# Patient Record
Sex: Male | Born: 1960
Health system: Southern US, Community
[De-identification: ages and names within clinical notes are randomized; demographics above are authoritative.]

## PROBLEM LIST (undated history)

## (undated) DIAGNOSIS — I10 Essential (primary) hypertension: Secondary | ICD-10-CM

## (undated) DIAGNOSIS — K269 Duodenal ulcer, unspecified as acute or chronic, without hemorrhage or perforation: Secondary | ICD-10-CM

## (undated) DIAGNOSIS — Z9889 Other specified postprocedural states: Secondary | ICD-10-CM

## (undated) DIAGNOSIS — L570 Actinic keratosis: Secondary | ICD-10-CM

## (undated) DIAGNOSIS — K635 Polyp of colon: Secondary | ICD-10-CM

## (undated) DIAGNOSIS — R011 Cardiac murmur, unspecified: Secondary | ICD-10-CM

## (undated) DIAGNOSIS — E7800 Pure hypercholesterolemia, unspecified: Secondary | ICD-10-CM

## (undated) DIAGNOSIS — I519 Heart disease, unspecified: Secondary | ICD-10-CM

## (undated) HISTORY — DX: Actinic keratosis: L57.0

## (undated) HISTORY — PX: APPENDECTOMY: SHX54

## (undated) HISTORY — DX: Cardiac murmur, unspecified: R01.1

## (undated) HISTORY — DX: Essential (primary) hypertension: I10

## (undated) HISTORY — PX: CARDIAC CATHETERIZATION: SHX172

## (undated) HISTORY — PX: SHOULDER ARTHROSCOPY W/ ROTATOR CUFF REPAIR: SHX2400

## (undated) SURGERY — LEFT HEART CATH AND CORONARY ANGIOGRAPHY
Anesthesia: Moderate Sedation

---

## 2017-06-20 DIAGNOSIS — Z9889 Other specified postprocedural states: Secondary | ICD-10-CM

## 2017-06-20 HISTORY — DX: Other specified postprocedural states: Z98.890

## 2017-06-30 ENCOUNTER — Encounter: Payer: Self-pay | Admitting: Family Medicine

## 2017-06-30 ENCOUNTER — Ambulatory Visit: Payer: BLUE CROSS/BLUE SHIELD | Admitting: Family Medicine

## 2017-06-30 VITALS — BP 120/60 | HR 84 | Temp 98.0°F | Resp 16 | Ht 69.0 in | Wt 201.0 lb

## 2017-06-30 DIAGNOSIS — I10 Essential (primary) hypertension: Secondary | ICD-10-CM | POA: Insufficient documentation

## 2017-06-30 DIAGNOSIS — M7071 Other bursitis of hip, right hip: Secondary | ICD-10-CM | POA: Diagnosis not present

## 2017-06-30 DIAGNOSIS — E785 Hyperlipidemia, unspecified: Secondary | ICD-10-CM | POA: Diagnosis not present

## 2017-06-30 DIAGNOSIS — K219 Gastro-esophageal reflux disease without esophagitis: Secondary | ICD-10-CM | POA: Insufficient documentation

## 2017-06-30 DIAGNOSIS — R011 Cardiac murmur, unspecified: Secondary | ICD-10-CM | POA: Insufficient documentation

## 2017-06-30 MED ORDER — ATORVASTATIN CALCIUM 10 MG PO TABS: 10.0000 mg | ORAL_TABLET | Freq: Every day | ORAL | 1 refills | Status: DC

## 2017-06-30 MED ORDER — AMLODIPINE BESY-BENAZEPRIL HCL 5-20 MG PO CAPS: 1.0000 | ORAL_CAPSULE | Freq: Every day | ORAL | 1 refills | Status: DC

## 2017-06-30 MED ORDER — NAPROXEN 500 MG PO TABS: 500.0000 mg | ORAL_TABLET | Freq: Two times a day (BID) | ORAL | 0 refills | Status: DC

## 2017-06-30 NOTE — Progress Notes (Signed)
Patient: Kevin Crawford Male    DOB: [birth year]   57 y.o.   MRN: [id number] Visit Date: 06/30/2017  Today's Provider: Lelon Huh, MD   Chief Complaint  Patient presents with  . Establish Care  . Back Pain  . Leg Pain   Subjective:    Patient presents to establish care. He recently moved from [place] after retiring from [employer] where he worked for several decades. He has history of hypertenstion and hyperlipidemia which have been well controlled. He brings in copy of most recently labs done over the summer which were normal.   Patient has had right hip and back pain for over a week . Patient states pain started in low right side of back and moved into right hip. Patient also reports some pain in right buttock. Patient states there was no injury mechanism, his back and hip began hurting out of the blue. Patient has had this same pain occur about 6 months ago. Patient has information concerning first episode with him. Patient has been using ice, heat and occasional Aleve for the pain with moderate relief.    Hip Pain   The incident occurred more than 1 week ago. The incident occurred at home. There was no injury mechanism. The pain is present in the right hip. The quality of the pain is described as shooting and cramping. The pain is at a severity of 4/10. The pain is moderate. The pain has been improving since onset. Associated symptoms include an inability to bear weight. He reports no foreign bodies present. The symptoms are aggravated by movement and weight bearing. He has tried ice, heat and rest for the symptoms. The treatment provided moderate relief.   States had similar pain about 6 months ago. He was referred to physical therapy and diagnosed with pyriformis syndrome and symptoms had completely resolved. Marland Kitchen Has been taking OTC Aleve and states pain has mostly resolved the last few days. He is going to [place] next week and is concerned it may act up while  travelling as he is expecting to do a lot of travelling.    Allergies  Allergen Reactions  . Phenergan [Promethazine Hcl] Other (See Comments)    SEIZURES  . Mobic [Meloxicam] Other (See Comments)    Severe cramping     Current Outpatient Medications:  .  amLODipine-benazepril (LOTREL) 5-20 MG capsule, Take 1 capsule by mouth daily., Disp: , Rfl:  .  atorvastatin (LIPITOR) 10 MG tablet, Take 10 mg by mouth daily., Disp: , Rfl:  .  Multiple Vitamin (MULTIVITAMIN) tablet, Take 1 tablet by mouth daily., Disp: , Rfl:  .  Omega-3 Fatty Acids (FISH OIL) 1000 MG CAPS, Take 1 capsule by mouth daily., Disp: , Rfl:  .  tadalafil (CIALIS) 5 MG tablet, Take 5 mg by mouth. Take 1 tablet By Mouth Twice A Week, Disp: , Rfl: 0  Review of Systems  Musculoskeletal: Positive for arthralgias, back pain and myalgias.  All other systems reviewed and are negative.   Social History   Tobacco Use  . Smoking status: Former Smoker    Last attempt to quit: 06/21/2003    Years since quitting: 14.0  . Smokeless tobacco: Never Used  Substance Use Topics  . Alcohol use: Yes    Comment: 5-7 drinks each week; varies   Objective:   BP 120/60 (BP Location: Right Arm, Patient Position: Sitting, Cuff Size: Large)   Pulse 84   Temp 98 F (36.7 C) (Oral)  Resp 16   Ht 5\' 9"  (1.753 m)   Wt 201 lb (91.2 kg)   SpO2 98%   BMI 29.68 kg/m  Vitals:   06/30/17 1547  BP: 120/60  Pulse: 84  Resp: 16  Temp: 98 F (36.7 C)  TempSrc: Oral  SpO2: 98%  Weight: 201 lb (91.2 kg)  Height: 5\' 9"  (1.753 m)     Physical Exam  General appearance: alert, well developed, well nourished, cooperative and in no distress Head: Normocephalic, without obvious abnormality, atraumatic Respiratory: Respirations even and unlabored, normal respiratory rate Extremities: Mild tenderness lumbar spine, right buttocks and left lateral leg.     Assessment & Plan:     1. Bursitis of other bursa of right hip Mostly resolved at  this time. Offered steroid injection which he declined. Sent prescription for naproxen to take with food if symptoms flare up. Continue scheduled treatments with heat and ice. C  2. Essential hypertension Well controlled.  Continue current medications.   - amLODipine-benazepril (LOTREL) 5-20 MG capsule; Take 1 capsule by mouth daily.  Dispense: 90 capsule; Refill: 1  3. Hyperlipidemia, unspecified hyperlipidemia type  - atorvastatin (LIPITOR) 10 MG tablet; Take 1 tablet (10 mg total) by mouth daily.  Dispense: 90 tablet; Refill: 1       Lelon Huh, MD  [facility]

## 2017-07-02 ENCOUNTER — Encounter: Payer: Self-pay | Admitting: Family Medicine

## 2017-08-25 ENCOUNTER — Encounter: Payer: Self-pay | Admitting: Family Medicine

## 2017-08-25 ENCOUNTER — Ambulatory Visit: Payer: BLUE CROSS/BLUE SHIELD | Admitting: Family Medicine

## 2017-08-25 VITALS — BP 132/80 | HR 82 | Temp 98.0°F | Resp 16

## 2017-08-25 DIAGNOSIS — R079 Chest pain, unspecified: Secondary | ICD-10-CM | POA: Diagnosis not present

## 2017-08-25 DIAGNOSIS — R011 Cardiac murmur, unspecified: Secondary | ICD-10-CM

## 2017-08-25 DIAGNOSIS — R0989 Other specified symptoms and signs involving the circulatory and respiratory systems: Secondary | ICD-10-CM | POA: Diagnosis not present

## 2017-08-25 DIAGNOSIS — M7071 Other bursitis of hip, right hip: Secondary | ICD-10-CM | POA: Diagnosis not present

## 2017-08-25 NOTE — Progress Notes (Signed)
Patient: Kevin Crawford Male    DOB: 1960-08-18   57 y.o.   MRN: [id number] Visit Date: 08/25/2017  Today's Provider: Lelon Huh, MD   Chief Complaint  Patient presents with  . Back Pain   Subjective:    Back Pain  This is a new problem. The current episode started more than 1 month ago. The problem occurs constantly. The problem has been gradually worsening since onset.   Patient reports that this pain is similar to the pain he had when he was seen in the office on 06/30/2017. He reports that due to a new position at his job, he has to do more physical labor that requires heavy lifting. He reports that the pain is in his lower back, and it is becoming more constant. He reports that the medication that was started (naproxen) helped a little, but it has not resolved the pain. He reports that on last visit physical therapy was recommended which he would like to pursue.   Pain had resolved until he recently started working at [employer] at [employer]. Pain is in left lower back and into right lateral leg. Back brace has helped.   Chest pain He reports a long history of chest pains with exertion which have more frequent and intense the last couple of months. He states he has a couple of stress tests in the past when he lived in [place] which were normal, but it has been several years and pains has gotten significantly worse. He requests coronary CT angiogram be ordered.  He had [facility] screening done lately which revealed moderate left carotid artery disease which he is concerned about. He denies any new neurological symptoms.      Allergies  Allergen Reactions  . Phenergan [Promethazine Hcl] Other (See Comments)    SEIZURES  . Mobic [Meloxicam] Other (See Comments)    Severe cramping     Current Outpatient Medications:  .  amLODipine-benazepril (LOTREL) 5-20 MG capsule, Take 1 capsule by mouth daily., Disp: 90 capsule, Rfl: 1 .  atorvastatin (LIPITOR) 10 MG tablet, Take 1  tablet (10 mg total) by mouth daily., Disp: 90 tablet, Rfl: 1 .  Multiple Vitamin (MULTIVITAMIN) tablet, Take 1 tablet by mouth daily., Disp: , Rfl:  .  naproxen (NAPROSYN) 500 MG tablet, Take 1 tablet (500 mg total) by mouth 2 (two) times daily with a meal., Disp: 30 tablet, Rfl: 0 .  Omega-3 Fatty Acids (FISH OIL) 1000 MG CAPS, Take 1 capsule by mouth daily., Disp: , Rfl:  .  tadalafil (CIALIS) 5 MG tablet, Take 5 mg by mouth. Take 1 tablet By Mouth Twice A Week, Disp: , Rfl: 0  Review of Systems  Constitutional: Negative.   Cardiovascular: Negative for leg swelling.  Musculoskeletal: Positive for back pain and myalgias.  Neurological: Negative.     Social History   Tobacco Use  . Smoking status: Former Smoker    Last attempt to quit: 06/21/2003    Years since quitting: 14.1  . Smokeless tobacco: Never Used  Substance Use Topics  . Alcohol use: Yes    Comment: 5-7 drinks each week; varies   Objective:   BP 132/80 (BP Location: Right Arm, Patient Position: Sitting, Cuff Size: Large)   Pulse 82   Temp 98 F (36.7 C)   Resp 16   SpO2 98%  Vitals:   08/25/17 1538  BP: 132/80  Pulse: 82  Resp: 16  Temp: 98 F (36.7 C)  SpO2:  98%     Physical Exam   General Appearance:    Alert, cooperative, no distress  Eyes:    PERRL, conjunctiva/corneas clear, EOM's intact       Lungs:     Clear to auscultation bilaterally, respirations unlabored  Heart:    Regular rate and rhythm, faint left carotic bruit. II/VI systolic murmur RUSB  Neurologic:   Awake, alert, oriented x 3. No apparent focal neurological           defect.           Assessment & Plan:     1. Bursitis of other bursa of right hip Recurrent with minimal relief from NSAIDs.  - Ambulatory referral to Physical Therapy  2. Chest pain, unspecified type He reports having negative stress tests in the past, but he feels that he needs a more detailed imaging and specifically requests a coronary CT angiogram. It has been  several years since last test, his chest pains have worsened, and e does have several cardiac risk factors. Counseled that he needs to discuss options of cardiac studies with an appropriately trained cardiologist.  - Ambulatory referral to Cardiology  3. Heart murmur Long standing by patient report.   4. Bruit of left carotid artery  - US Carotid Duplex Bilateral; Future  Continue statin, ECASA and current blood pressure medications.       Lelon Huh, MD  [facility]

## 2017-08-25 NOTE — Patient Instructions (Signed)
   Reduce alcohol to either one glass of liquor or 2 glasses of wine per day

## 2017-08-30 ENCOUNTER — Other Ambulatory Visit: Payer: Self-pay

## 2017-08-30 ENCOUNTER — Encounter: Payer: Self-pay | Admitting: Physical Therapy

## 2017-08-30 ENCOUNTER — Ambulatory Visit: Payer: BLUE CROSS/BLUE SHIELD | Attending: Family Medicine | Admitting: Physical Therapy

## 2017-08-30 ENCOUNTER — Ambulatory Visit: Payer: BLUE CROSS/BLUE SHIELD | Admitting: Physical Therapy

## 2017-08-30 DIAGNOSIS — M6281 Muscle weakness (generalized): Secondary | ICD-10-CM

## 2017-08-30 DIAGNOSIS — M25551 Pain in right hip: Secondary | ICD-10-CM | POA: Insufficient documentation

## 2017-08-30 DIAGNOSIS — M545 Low back pain: Secondary | ICD-10-CM | POA: Insufficient documentation

## 2017-08-30 DIAGNOSIS — M62830 Muscle spasm of back: Secondary | ICD-10-CM | POA: Diagnosis present

## 2017-08-30 DIAGNOSIS — R262 Difficulty in walking, not elsewhere classified: Secondary | ICD-10-CM | POA: Diagnosis present

## 2017-08-31 ENCOUNTER — Ambulatory Visit
Admission: RE | Admit: 2017-08-31 | Discharge: 2017-08-31 | Disposition: A | Discharge status: Home / Self Care | Payer: BLUE CROSS/BLUE SHIELD | Source: Ambulatory Visit | Attending: Family Medicine | Admitting: Family Medicine

## 2017-08-31 ENCOUNTER — Telehealth: Payer: Self-pay

## 2017-08-31 ENCOUNTER — Other Ambulatory Visit: Payer: Self-pay

## 2017-08-31 DIAGNOSIS — R9389 Abnormal findings on diagnostic imaging of other specified body structures: Secondary | ICD-10-CM | POA: Insufficient documentation

## 2017-08-31 DIAGNOSIS — I6523 Occlusion and stenosis of bilateral carotid arteries: Secondary | ICD-10-CM | POA: Diagnosis not present

## 2017-08-31 DIAGNOSIS — R0989 Other specified symptoms and signs involving the circulatory and respiratory systems: Secondary | ICD-10-CM

## 2017-08-31 MED ORDER — IOPAMIDOL (ISOVUE-370) INJECTION 76%
75.0000 mL | Freq: Once | INTRAVENOUS | Status: AC | PRN
  Administered 2017-08-31: 75 mL via INTRAVENOUS

## 2017-08-31 NOTE — Telephone Encounter (Signed)
Call report from Ultrasound:    IMPRESSION: Less than 50% stenosis in the right and left internal carotid arteries.  Linear hyperechoic structure within the left bulb is noted. This may simply represent chronic atherosclerotic plaque. A non flow limiting dissection cannot be excluded. Consider CT angiogram of the neck to further characterize.  Dr. Rosanna Randy advised. Order for CT placed.

## 2017-08-31 NOTE — Therapy (Signed)
[facility] [address], [place], [address] Phone: [phone]   Fax:  [phone]  Physical Therapy Evaluation  Patient Details  Name: Kevin Crawford MRN: [id number] Date of Birth: 1960-09-18 Referring Provider: Lelon Huh MD   Encounter Date: 08/30/2017  PT End of Session - 08/30/17 1924    Visit Number  1    Number of Visits  12    Date for PT Re-Evaluation  10/11/17    PT Start Time  2542    PT Stop Time  1614    PT Time Calculation (min)  58 min    Activity Tolerance  Patient tolerated treatment well    Behavior During Therapy  [facility] for tasks assessed/performed       Past Medical History:  Diagnosis Date  . GERD (gastroesophageal reflux disease)   . Heart murmur   . Hyperlipidemia   . Hypertension     Past Surgical History:  Procedure Laterality Date  . SHOULDER ARTHROSCOPY W/ ROTATOR CUFF REPAIR Right     There were no vitals filed for this visit.   Subjective Assessment - 08/30/17 1532    Subjective  reports right hip bone lateral aspect very painful with standing and walking. heat relieves symptoms.     Pertinent History  previous back pain about a year ago left side, went to therapy with good results with STM with ball and exercise at home. this current episode began january 2019 for no apparent reason: retired 03/2017 and became sedentary and noticed back pain which resolved within 2 weeks. 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    Limitations  Lifting;Standing;House hold activities;Walking;Other (comment) working     How long can you sit comfortably?  short periods    How long can you stand comfortably?  unable    How long can you walk comfortably?  unable    Patient Stated Goals  to relieve the back and right hip pain to be able to walk  with less difficulty and pain    Currently in Pain?  Yes    Pain Score  4  worst 10/10; best 0/10    Pain Location  Hip    Pain Orientation  Right    Pain Descriptors / Indicators  Aching    Pain Type  Acute pain    Pain Onset  1 to 4 weeks ago    Pain Frequency  Intermittent    Effect of Pain on Daily Activities  limits all ADL's and work related duties         [facility] PT Assessment - 08/30/17 1525      Assessment   Medical Diagnosis  right hip bursitis, back pain with sciatica    Referring Provider  Lelon Huh MD    Onset Date/Surgical Date  08/18/17 back pain january, resolved and then hip began 2 weeks ago    Hand Dominance  Right    Prior Therapy  yes  01/2017 for back pain with with good results      Precautions   Precautions  None      Balance Screen   Has the patient fallen in the past 6 months  No      [place] residence    Living Arrangements  Spouse/significant  other;Children    Type of Home  House    Home Access  Stairs to enter    Entrance Stairs-Number of Steps  3    Entrance Stairs-Rails  None    Home Layout  One level      Prior Function   Level of Independence  Independent    Vocation  Part time employment;Retired    Biomedical scientist  walking, standing, [employer] treasurer, tiled floor      Cognition   Overall Cognitive Status  Within Functional Limits for tasks assessed      Observation/Other Assessments   Focus on Therapeutic Outcomes (FOTO)   to be assessed     Lower Extremity Functional Scale   to be assessed      ROM / Strength   AROM / PROM / Strength  AROM;Strength      AROM   Overall AROM Comments  lumbar spine flexion, extension limited 50% without reprocuction of symptoms; LE's decreased right hip ER with pain        Strength   Overall Strength Comments  grossly bilateral LE's major muscle groups WFL without reproduction of symtpoms      Palpation   Palpation comment   spasms  along lumbar spine right side       FABER test   findings  Positive    Side  Right    Comment  mid symptom reproduction      Slump test   Findings  Negative    Comment  bilaterally      Straight Leg Raise   Findings  Negative    Comment  each LE and crossed SLR negative       SI Compression   Findings  Negative      SI Distraction   Findings   Negative      Ambulation/Gait   Gait Comments  antalgic             Objective measurements completed on examination: See above findings.      PT Education - 08/30/17 1800    Education provided  Yes    Education Details  POC for therapy; exercise instruction for home: body mechanics, hip adduction with ball and glute sets, hip abduction with resistive band    Person(s) Educated  Patient    Methods  Explanation;Demonstration;Verbal cues;Handout    Comprehension  Verbalized understanding;Returned demonstration          PT Long Term Goals - 08/30/17 2037      PT LONG TERM GOAL #1   Title  Patient will report max pain level 5/10 in right hip and lower back with standing and walking to demonstrate improved function with daily tasks at home/work    Baseline  pain worst 10/10    Status  New    Target Date  09/20/17      PT LONG TERM GOAL #2   Title  Patient will report max pain level 3/10 in right hip and lower back with standing and walking to demonstrate improved function with daily tasks at home/work    Baseline  pain worst 10/10    Status  New    Target Date  10/11/17      PT LONG TERM GOAL #3   Title  patient will improve LEFS score to 60/80 demonstrating improved function with daily tasks with less difficulty    Baseline  LEFS    Status  New    Target Date  10/11/17  PT LONG TERM GOAL #4   Title  patient will be independent with pain control, exercises for self managment of symptoms at discharge from physical therapy    Baseline  limited to no knowledge of appropriate pain control stratgies, exercises  progression without guidance, cuing    Status  New    Target Date  10/11/17             Plan - 08/30/17 1800    Clinical Impression Statement  patient is a 57 year old male who presents with back and right hip pain that has worsened in symptoms since January 2019. He has difficulty with standing, walking which limits function at home and work. He has limited to no knowledge of appropriate pain control strategies or exercise progression and will benefit from physical therapy intervention to improve function.     History and Personal Factors relevant to plan of care:  previous back pain about a year ago left side, went to therapy with good results with STM with ball and exercise at home. this current episode began january 2019 for no apparent reason: retired 03/2017 and became sedentary and noticed back pain which resolved within 2 weeks. 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    Clinical Presentation  Evolving    Clinical Presentation due to:  worsening symptoms into right LE/hip    Clinical Decision Making  Low    Rehab Potential  Good    Clinical Impairments Affecting Rehab Potential  (+)age, acute condition, motivated(-)    PT Frequency  2x / week    PT Duration  6 weeks    PT Treatment/Interventions  Electrical Stimulation;Cryotherapy;Iontophoresis 4mg /ml Dexamethasone;Moist Heat;Ultrasound;Therapeutic exercise;Therapeutic activities;Neuromuscular re-education;Patient/family education;Manual techniques;Dry needling    PT Next Visit Plan  pain control, progressive exercise, manual techniques    PT Home Exercise Plan  posture awareness, body mechanics for daily tasks, exercise sitting hip adduction, abduction isometrics    Consulted and Agree with Plan of Care  Patient       Patient will benefit from skilled therapeutic intervention in order to  improve the following deficits and impairments:  Abnormal gait, Pain, Improper body mechanics, Increased muscle spasms, Decreased activity tolerance, Decreased endurance, Decreased range of motion, Decreased strength, Impaired perceived functional ability  Visit Diagnosis: Pain in right hip - Plan: PT plan of care cert/re-cert  Bilateral low back pain, unspecified chronicity, with sciatica presence unspecified - Plan: PT plan of care cert/re-cert  Muscle weakness (generalized) - Plan: PT plan of care cert/re-cert  Difficulty in walking, not elsewhere classified - Plan: PT plan of care cert/re-cert     Problem List Patient Active Problem List   Diagnosis Date Noted  . GERD (gastroesophageal reflux disease) 06/30/2017  . Heart murmur 06/30/2017  . Hyperlipidemia 06/30/2017  . Hypertension 06/30/2017    Jomarie Longs PT 08/31/2017, 8:51 PM  [facility] [address] [place], [address] Phone: [phone]   Fax:  [phone]  Name: Kevin Crawford MRN: [id number] Date of Birth: 06-20-1961

## 2017-09-06 ENCOUNTER — Encounter: Payer: Self-pay | Admitting: Physical Therapy

## 2017-09-06 ENCOUNTER — Ambulatory Visit: Payer: BLUE CROSS/BLUE SHIELD | Admitting: Physical Therapy

## 2017-09-06 DIAGNOSIS — M25551 Pain in right hip: Secondary | ICD-10-CM

## 2017-09-06 DIAGNOSIS — M545 Low back pain: Secondary | ICD-10-CM

## 2017-09-06 DIAGNOSIS — M62830 Muscle spasm of back: Secondary | ICD-10-CM

## 2017-09-06 DIAGNOSIS — M6281 Muscle weakness (generalized): Secondary | ICD-10-CM

## 2017-09-06 NOTE — Therapy (Signed)
[facility] [address] [place], [address] Phone: [phone]   Fax:  [phone]  Physical Therapy Treatment  Patient Details  Name: Kevin Crawford MRN: [id number] Date of Birth: 1960/07/11 Referring Provider: Lelon Huh MD   Encounter Date: 09/06/2017  PT End of Session - 09/06/17 1107    Visit Number  2    Number of Visits  12    Date for PT Re-Evaluation  10/11/17    PT Start Time  1020    PT Stop Time  1106    PT Time Calculation (min)  46 min    Activity Tolerance  Patient tolerated treatment well    Behavior During Therapy  [facility] for tasks assessed/performed       Past Medical History:  Diagnosis Date  . GERD (gastroesophageal reflux disease)   . Heart murmur   . Hyperlipidemia   . Hypertension     Past Surgical History:  Procedure Laterality Date  . SHOULDER ARTHROSCOPY W/ ROTATOR CUFF REPAIR Right     There were no vitals filed for this visit.  Subjective Assessment - 09/06/17 1032    Subjective  Patient reports he is having minor pains intermittently in lower back at this time. He is exercising as instructed and added a few more stretches and leg lifts prone.     Pertinent History  previous back pain about a year ago left side, went to therapy with good results with STM with ball and exercise at home. this current episode began january 2019 for no apparent reason: retired 03/2017 and became sedentary and noticed back pain which resolved within 2 weeks. 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    Limitations  Lifting;Standing;House hold activities;Walking;Other (comment) working     How long can you sit comfortably?  short periods    How long can you stand comfortably?  unable    How long can you walk comfortably?  unable    Patient Stated  Goals  to relieve the back and right hip pain to be able to walk with less difficulty and pain    Currently in Pain?  No/denies       Objective:  Gait: WNL with equal weight distribution, trunk rotation  AROM: lumbar spine flexion 25% limited, extension < 20% limited and both improved from previous session   Treatment: Manual therapy: 13 min. goal: improve soft tissue elasticity, decrease spasm patient prone lying over one pillow: STM performed with superficial techniques to lower back, lumbar paraspinal and QL muscles right > left side  Therapeutic exercise: Patient performed with demonstration, verbal cues of therapist: goal: independent with home program, improved function with daily tasks, LEFS Prone lying:  Hip extension 2 x 5 reps each LE Upper back extension with arms at sides x 5 reps  Standing:  Diagonal wedding march holding (2) 5# weights x 2 min. Side stepping with green resistive band around thighs x 1.5 min with patient reporting increased soreness bilateral hips Re assessed HEP verbally and discussed new exercises patient is performing at home including proper stretching for back  Patient response to treatment: patient demonstrated improved technique with exercises with minimal VC for correct alignment. Patient with decreased spasms by 50% following STM. Improved motor control with repetition and cuing     PT Education -  09/06/17 1338    Education provided  Yes    Education Details  exercise instruction for HEP; re assessed exercises given    Person(s) Educated  Patient    Methods  Explanation;Demonstration;Verbal cues;Handout    Comprehension  Verbalized understanding;Returned demonstration;Verbal cues required          PT Long Term Goals - 08/30/17 2037      PT LONG TERM GOAL #1   Title  Patient will report max pain level 5/10 in right hip and lower back with standing and walking to demonstrate improved function with daily tasks at home/work    Baseline   pain worst 10/10    Status  New    Target Date  09/20/17      PT LONG TERM GOAL #2   Title  Patient will report max pain level 3/10 in right hip and lower back with standing and walking to demonstrate improved function with daily tasks at home/work    Baseline  pain worst 10/10    Status  New    Target Date  10/11/17      PT LONG TERM GOAL #3   Title  patient will improve LEFS score to 60/80 demonstrating improved function with daily tasks with less difficulty    Baseline  LEFS    Status  New    Target Date  10/11/17      PT LONG TERM GOAL #4   Title  patient will be independent with pain control, exercises for self managment of symptoms at discharge from physical therapy    Baseline  limited to no knowledge of appropriate pain control stratgies, exercises progression without guidance, cuing    Status  New    Target Date  10/11/17            Plan - 09/06/17 1338    Clinical Impression Statement  Patient is progressing well towards goals with decreasing pain and improving strength and knowledge of exercise progression. He demonstrated decreased spasms in right lower back and improved technqiue with exercise with demonstration, verbal cues and repetition.     Rehab Potential  Good    Clinical Impairments Affecting Rehab Potential  (+)age, acute condition, motivated(-)    PT Frequency  2x / week    PT Duration  6 weeks    PT Treatment/Interventions  Electrical Stimulation;Cryotherapy;Iontophoresis 4mg /ml Dexamethasone;Moist Heat;Ultrasound;Therapeutic exercise;Therapeutic activities;Neuromuscular re-education;Patient/family education;Manual techniques;Dry needling    PT Next Visit Plan  pain control, progressive exercise, manual techniques    PT Home Exercise Plan  posture awareness, body mechanics for daily tasks, exercise sitting hip adduction, abduction isometrics       Patient will benefit from skilled therapeutic intervention in order to improve the following deficits and  impairments:  Abnormal gait, Pain, Improper body mechanics, Increased muscle spasms, Decreased activity tolerance, Decreased endurance, Decreased range of motion, Decreased strength, Impaired perceived functional ability  Visit Diagnosis: Pain in right hip  Bilateral low back pain, unspecified chronicity, with sciatica presence unspecified  Muscle weakness (generalized)  Muscle spasm of back     Problem List Patient Active Problem List   Diagnosis Date Noted  . GERD (gastroesophageal reflux disease) 06/30/2017  . Heart murmur 06/30/2017  . Hyperlipidemia 06/30/2017  . Hypertension 06/30/2017    Jomarie Longs PT 09/06/2017, 1:41 PM  [place] [facility] [address] [place], [address] Phone: [phone]   Fax:  [phone]  Name: Kevin Crawford MRN: [id number] Date of Birth:  01/24/1961   

## 2017-09-07 ENCOUNTER — Ambulatory Visit: Payer: BLUE CROSS/BLUE SHIELD | Admitting: Physical Therapy

## 2017-09-11 ENCOUNTER — Ambulatory Visit: Payer: BLUE CROSS/BLUE SHIELD | Admitting: Physical Therapy

## 2017-09-13 ENCOUNTER — Ambulatory Visit: Payer: BLUE CROSS/BLUE SHIELD | Admitting: Physical Therapy

## 2017-09-13 ENCOUNTER — Encounter: Payer: Self-pay | Admitting: Physical Therapy

## 2017-09-13 DIAGNOSIS — M25551 Pain in right hip: Secondary | ICD-10-CM

## 2017-09-13 DIAGNOSIS — M6281 Muscle weakness (generalized): Secondary | ICD-10-CM

## 2017-09-13 DIAGNOSIS — M62830 Muscle spasm of back: Secondary | ICD-10-CM

## 2017-09-13 DIAGNOSIS — M545 Low back pain: Secondary | ICD-10-CM

## 2017-09-14 NOTE — Therapy (Signed)
[facility] [address] [place], [address] Phone: [phone]   Fax:  [phone]  Physical Therapy Treatment  Patient Details  Name: Malin Cervini MRN: [id number] Date of Birth: 26-Oct-1960 Referring Provider: Lelon Huh MD   Encounter Date: 09/13/2017  PT End of Session - 09/13/17 1434    Visit Number  3    Number of Visits  12    Date for PT Re-Evaluation  10/11/17    PT Start Time  9381    PT Stop Time  1511    PT Time Calculation (min)  39 min    Activity Tolerance  Patient tolerated treatment well    Behavior During Therapy  [facility] for tasks assessed/performed       Past Medical History:  Diagnosis Date  . GERD (gastroesophageal reflux disease)   . Heart murmur   . Hyperlipidemia   . Hypertension     Past Surgical History:  Procedure Laterality Date  . SHOULDER ARTHROSCOPY W/ ROTATOR CUFF REPAIR Right     There were no vitals filed for this visit.  Subjective Assessment - 09/13/17 1433    Subjective  Patient reports he is doing well and is not having pain as long as he consistently exercises in the morning.     Pertinent History  previous back pain about a year ago left side, went to therapy with good results with STM with ball and exercise at home. this current episode began january 2019 for no apparent reason: retired 03/2017 and became sedentary and noticed back pain which resolved within 2 weeks. 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    Limitations  Lifting;Standing;House hold activities;Walking;Other (comment) working     How long can you sit comfortably?  short periods    How long can you stand comfortably?  unable    How long can you walk comfortably?  unable    Patient Stated Goals  to relieve the back and right hip pain to be able to  walk with less difficulty and pain    Currently in Pain?  No/denies          Objective:  Gait: WNL with equal weight distribution, trunk rotation  Palpation: lumbar spine mild increased tone muscles, significantly decreased from previous session  Treatment:  Therapeutic exercise: Patient performed with demonstration, verbal cues of therapist: goal: independent with home program, improved function with daily tasks, LEFS  Prone lying:  Hip extension 2 x 5 reps each LE  side lying clamshells x 15 reps each LE   Standing:  resistive band walking forward x 10, backwards x 10, side step x 5 each right/left hip rotary machine for hip extension 100# x 15 each LE; hip abduction 55# x 15 reps each LE blue resistive band around thighs standing hip abduction and extension alternating each exercise/LE 3 x 5 reps    Patient response to treatment: improved technique with exercises with minimal verbal cuing and demonstraion and repetition.    PT Education - 09/13/17 1434    Education provided  Yes    Education Details  exercise instruction for HEP re assessed    Person(s) Educated  Patient    Methods  Explanation;Demonstration;Verbal cues    Comprehension  Verbalized understanding;Returned demonstration;Verbal cues required  PT Long Term Goals - 08/30/17 2037      PT LONG TERM GOAL #1   Title  Patient will report max pain level 5/10 in right hip and lower back with standing and walking to demonstrate improved function with daily tasks at home/work    Baseline  pain worst 10/10    Status  New    Target Date  09/20/17      PT LONG TERM GOAL #2   Title  Patient will report max pain level 3/10 in right hip and lower back with standing and walking to demonstrate improved function with daily tasks at home/work    Baseline  pain worst 10/10    Status  New    Target Date  10/11/17      PT LONG TERM GOAL #3   Title  patient will improve LEFS score to 60/80 demonstrating improved  function with daily tasks with less difficulty    Baseline  LEFS    Status  New    Target Date  10/11/17      PT LONG TERM GOAL #4   Title  patient will be independent with pain control, exercises for self managment of symptoms at discharge from physical therapy    Baseline  limited to no knowledge of appropriate pain control stratgies, exercises progression without guidance, cuing    Status  New    Target Date  10/11/17            Plan - 09/13/17 1541    Clinical Impression Statement  Patient continues to progress towards goals wtih decreasing pain and progressing exercises for strength and flexibility with minimal cuing. He continues with lower back pain that is intermittent and he is improving function with daily tasks.     Rehab Potential  Good    Clinical Impairments Affecting Rehab Potential  (+)age, acute condition, motivated(-)    PT Frequency  2x / week    PT Duration  6 weeks    PT Treatment/Interventions  Electrical Stimulation;Cryotherapy;Iontophoresis 4mg /ml Dexamethasone;Moist Heat;Ultrasound;Therapeutic exercise;Therapeutic activities;Neuromuscular re-education;Patient/family education;Manual techniques;Dry needling    PT Next Visit Plan  pain control, progressive exercise, manual techniques    PT Home Exercise Plan  posture awareness, body mechanics for daily tasks, exercise sitting hip adduction, abduction isometrics       Patient will benefit from skilled therapeutic intervention in order to improve the following deficits and impairments:  Abnormal gait, Pain, Improper body mechanics, Increased muscle spasms, Decreased activity tolerance, Decreased endurance, Decreased range of motion, Decreased strength, Impaired perceived functional ability  Visit Diagnosis: Pain in right hip  Bilateral low back pain, unspecified chronicity, with sciatica presence unspecified  Muscle weakness (generalized)  Muscle spasm of back     Problem List Patient Active Problem  List   Diagnosis Date Noted  . GERD (gastroesophageal reflux disease) 06/30/2017  . Heart murmur 06/30/2017  . Hyperlipidemia 06/30/2017  . Hypertension 06/30/2017    Jomarie Longs PT 09/14/2017, 9:49 PM  [place] [address] [place], [address] Phone: [phone]   Fax:  [phone]  Name: Krishawn Vanderweele MRN: [id number] Date of Birth: Nov 14, 1960

## 2017-09-20 ENCOUNTER — Ambulatory Visit: Payer: BLUE CROSS/BLUE SHIELD | Admitting: Physical Therapy

## 2017-09-27 ENCOUNTER — Ambulatory Visit: Payer: BLUE CROSS/BLUE SHIELD | Admitting: Physical Therapy

## 2017-09-27 ENCOUNTER — Other Ambulatory Visit: Payer: Self-pay | Admitting: Family Medicine

## 2017-09-28 ENCOUNTER — Encounter
Admission: RE | Disposition: A | Discharge status: Home / Self Care | Payer: Self-pay | Source: Ambulatory Visit | Attending: Cardiology

## 2017-09-28 ENCOUNTER — Ambulatory Visit
Admission: RE | Admit: 2017-09-28 | Discharge: 2017-09-28 | Disposition: A | Discharge status: Home / Self Care | Payer: BLUE CROSS/BLUE SHIELD | Source: Ambulatory Visit | Attending: Cardiology | Admitting: Cardiology

## 2017-09-28 DIAGNOSIS — I2510 Atherosclerotic heart disease of native coronary artery without angina pectoris: Secondary | ICD-10-CM | POA: Diagnosis not present

## 2017-09-28 DIAGNOSIS — R0789 Other chest pain: Secondary | ICD-10-CM | POA: Diagnosis not present

## 2017-09-28 DIAGNOSIS — I10 Essential (primary) hypertension: Secondary | ICD-10-CM | POA: Insufficient documentation

## 2017-09-28 DIAGNOSIS — E785 Hyperlipidemia, unspecified: Secondary | ICD-10-CM | POA: Insufficient documentation

## 2017-09-28 DIAGNOSIS — R079 Chest pain, unspecified: Secondary | ICD-10-CM | POA: Diagnosis present

## 2017-09-28 DIAGNOSIS — Z87891 Personal history of nicotine dependence: Secondary | ICD-10-CM | POA: Diagnosis not present

## 2017-09-28 HISTORY — PX: INTRAVASCULAR PRESSURE WIRE/FFR STUDY: CATH118243

## 2017-09-28 HISTORY — PX: LEFT HEART CATH AND CORONARY ANGIOGRAPHY: CATH118249

## 2017-09-28 HISTORY — DX: Other specified postprocedural states: Z98.890

## 2017-09-28 SURGERY — LEFT HEART CATH AND CORONARY ANGIOGRAPHY
Anesthesia: Moderate Sedation

## 2017-09-28 MED ORDER — IOPAMIDOL (ISOVUE-300) INJECTION 61%
INTRAVENOUS | Status: DC | PRN
  Administered 2017-09-28: 190 mL via INTRA_ARTERIAL

## 2017-09-28 MED ORDER — ACETAMINOPHEN 325 MG PO TABS: 650.0000 mg | ORAL_TABLET | ORAL | Status: DC | PRN

## 2017-09-28 MED ORDER — FENTANYL CITRATE (PF) 100 MCG/2ML IJ SOLN
INTRAMUSCULAR | Status: AC
  Filled 2017-09-28: qty 2

## 2017-09-28 MED ORDER — SODIUM CHLORIDE 0.9% FLUSH: 3.0000 mL | Freq: Two times a day (BID) | INTRAVENOUS | Status: DC

## 2017-09-28 MED ORDER — HEPARIN SODIUM (PORCINE) 1000 UNIT/ML IJ SOLN
INTRAMUSCULAR | Status: DC | PRN
  Administered 2017-09-28: 4000 [IU] via INTRAVENOUS

## 2017-09-28 MED ORDER — MIDAZOLAM HCL 2 MG/2ML IJ SOLN
INTRAMUSCULAR | Status: AC
  Filled 2017-09-28: qty 2

## 2017-09-28 MED ORDER — SODIUM CHLORIDE 0.9 % WEIGHT BASED INFUSION: 1.0000 mL/kg/h | INTRAVENOUS | Status: DC

## 2017-09-28 MED ORDER — SODIUM CHLORIDE 0.9% FLUSH: 3.0000 mL | INTRAVENOUS | Status: DC | PRN

## 2017-09-28 MED ORDER — SODIUM CHLORIDE 0.9 % IV SOLN: 250.0000 mL | INTRAVENOUS | Status: DC | PRN

## 2017-09-28 MED ORDER — FENTANYL CITRATE (PF) 100 MCG/2ML IJ SOLN
INTRAMUSCULAR | Status: DC | PRN
  Administered 2017-09-28 (×2): 25 ug via INTRAVENOUS

## 2017-09-28 MED ORDER — SODIUM CHLORIDE 0.9 % WEIGHT BASED INFUSION
3.0000 mL/kg/h | INTRAVENOUS | Status: AC
  Administered 2017-09-28: 3 mL/kg/h via INTRAVENOUS

## 2017-09-28 MED ORDER — MIDAZOLAM HCL 2 MG/2ML IJ SOLN
INTRAMUSCULAR | Status: DC | PRN
  Administered 2017-09-28 (×2): 1 mg via INTRAVENOUS

## 2017-09-28 MED ORDER — HEPARIN SODIUM (PORCINE) 1000 UNIT/ML IJ SOLN
INTRAMUSCULAR | Status: AC
  Filled 2017-09-28: qty 1

## 2017-09-28 MED ORDER — HEPARIN (PORCINE) IN NACL 2-0.9 UNIT/ML-% IJ SOLN
INTRAMUSCULAR | Status: AC
  Filled 2017-09-28: qty 500

## 2017-09-28 MED ORDER — ONDANSETRON HCL 4 MG/2ML IJ SOLN: 4.0000 mg | Freq: Four times a day (QID) | INTRAMUSCULAR | Status: DC | PRN

## 2017-09-28 MED ORDER — VERAPAMIL HCL 2.5 MG/ML IV SOLN
INTRAVENOUS | Status: AC
  Filled 2017-09-28: qty 2

## 2017-09-28 MED ORDER — ASPIRIN 81 MG PO CHEW
81.0000 mg | CHEWABLE_TABLET | ORAL | Status: AC
  Administered 2017-09-28: 81 mg via ORAL

## 2017-09-28 MED ORDER — ASPIRIN 81 MG PO CHEW
CHEWABLE_TABLET | ORAL | Status: AC
  Filled 2017-09-28: qty 1

## 2017-09-28 SURGICAL SUPPLY — 12 items
BAND COMPRESS 30CM LRG LONG (HEMOSTASIS) ×1
BAND ZEPHYR COMPRESS 30 LONG (HEMOSTASIS) ×3 IMPLANT
CATH 5F 110X4 TIG (CATHETERS) ×4 IMPLANT
CATH INFINITI 5FR ANG PIGTAIL (CATHETERS) ×4 IMPLANT
CATH VISTA GUIDE 6FR JL3.5 (CATHETERS) ×4 IMPLANT
CATH VISTA GUIDE 6FR XB3.5 (CATHETERS) ×4 IMPLANT
KIT MANI 3VAL PERCEP (MISCELLANEOUS) ×4 IMPLANT
PACK CARDIAC CATH (CUSTOM PROCEDURE TRAY) ×4 IMPLANT
SHEATH RAIN RADIAL 21G 6FR (SHEATH) ×4 IMPLANT
VALVE COPILOT STAT (MISCELLANEOUS) ×4 IMPLANT
WIRE PRESSURE VERRATA (WIRE) ×4 IMPLANT
WIRE ROSEN-J .035X260CM (WIRE) ×4 IMPLANT

## 2017-09-29 ENCOUNTER — Encounter: Payer: Self-pay | Admitting: Family Medicine

## 2017-09-29 ENCOUNTER — Ambulatory Visit: Payer: BLUE CROSS/BLUE SHIELD | Admitting: Family Medicine

## 2017-09-29 VITALS — BP 110/68 | HR 73 | Temp 97.8°F | Resp 16

## 2017-09-29 DIAGNOSIS — E041 Nontoxic single thyroid nodule: Secondary | ICD-10-CM | POA: Diagnosis not present

## 2017-09-29 DIAGNOSIS — I10 Essential (primary) hypertension: Secondary | ICD-10-CM | POA: Diagnosis not present

## 2017-09-29 DIAGNOSIS — IMO0001 Reserved for inherently not codable concepts without codable children: Secondary | ICD-10-CM | POA: Insufficient documentation

## 2017-09-29 DIAGNOSIS — R911 Solitary pulmonary nodule: Secondary | ICD-10-CM | POA: Diagnosis not present

## 2017-09-29 DIAGNOSIS — E785 Hyperlipidemia, unspecified: Secondary | ICD-10-CM | POA: Diagnosis not present

## 2017-09-29 NOTE — Progress Notes (Signed)
       Patient: Kevin Crawford Male    DOB: 03-02-1961   57 y.o.   MRN: [id number] Visit Date: 09/29/2017  Today's Provider: Lelon Huh, MD   Chief Complaint  Patient presents with  . Follow-up   Subjective:    HPI Follow up: Patient is here today to discuss recent findings from yesterdays cardiac catheterization by Dr. Saralyn Pilar revealing 50% stenosis of LAD. He tolerated procedure well without complication. He has been taking 10mg  atorvastatin for many years and is concerned about effect of higher statin doses on his liver.   He was also noted to have subcentimeter lower lung nodule on CTA chest and small thyroid nodule on CTA neck last month. He did smoke 1-2 ppd for around 25 years and quit in 2005.     Allergies  Allergen Reactions  . Phenergan [Promethazine Hcl] Other (See Comments)    SEIZURES  . Mobic [Meloxicam] Other (See Comments)    Severe cramping     Current Outpatient Medications:  .  amLODipine-benazepril (LOTREL) 5-20 MG capsule, Take 1 capsule by mouth daily., Disp: 90 capsule, Rfl: 1 .  atorvastatin (LIPITOR) 10 MG tablet, Take 1 tablet (10 mg total) by mouth daily., Disp: 90 tablet, Rfl: 1 .  Multiple Vitamin (MULTIVITAMIN) tablet, Take 1 tablet by mouth daily., Disp: , Rfl:  .  Omega-3 Fatty Acids (FISH OIL) 1000 MG CAPS, Take 1,000 mg by mouth 4 (four) times a week. , Disp: , Rfl:  .  OVER THE COUNTER MEDICATION, Apply 1 application topically daily as needed (for pain). CBD Lotion, Disp: , Rfl:  .  tadalafil (CIALIS) 5 MG tablet, TAKE 1 TABLET BY MOUTH TWICE A WEEK, Disp: 15 tablet, Rfl: 3 .  naproxen (NAPROSYN) 500 MG tablet, Take 1 tablet (500 mg total) by mouth 2 (two) times daily with a meal. (Patient not taking: Reported on 09/25/2017), Disp: 30 tablet, Rfl: 0  Review of Systems  Constitutional: Negative for appetite change, chills and fever.  Respiratory: Negative for chest tightness, shortness of breath and wheezing.   Cardiovascular: Negative  for chest pain and palpitations.  Gastrointestinal: Negative for abdominal pain, nausea and vomiting.    Social History   Tobacco Use  . Smoking status: Former Smoker    Last attempt to quit: 06/21/2003    Years since quitting: 14.2  . Smokeless tobacco: Former Systems developer    Quit date: 06/21/2003  Substance Use Topics  . Alcohol use: Yes    Comment: 5-7 drinks each week; varies   Objective:   BP 110/68 (BP Location: Left Arm, Patient Position: Sitting, Cuff Size: Large)   Pulse 73   Temp 97.8 F (36.6 C) (Oral)   Resp 16   SpO2 97% Comment: room air    Physical Exam  General appearance: alert, well developed, well nourished, cooperative and in no distress Head: Normocephalic, without obvious abnormality, atraumatic Respiratory: Respirations even and unlabored, normal respiratory rate      Assessment & Plan:     1. Hyperlipidemia, unspecified hyperlipidemia type Will likely benefit from higher intensity statin therapy - Direct LDL - Comprehensive metabolic panel - Lipid panel  2. Essential hypertension Well controlled.  Continue current medications.    3. Lung nodule < 6cm on CT Repeat CT 6-12 months.   4. Solitary thyroid nodule Thyroid u/s 6-12 months.        Lelon Huh, MD  [facility]

## 2017-10-04 LAB — COMPREHENSIVE METABOLIC PANEL
A/G RATIO: 2.2 (ref 1.2–2.2)
ALK PHOS: 63 IU/L (ref 39–117)
ALT: 34 IU/L (ref 0–44)
AST: 21 IU/L (ref 0–40)
Albumin: 4.8 g/dL (ref 3.5–5.5)
BILIRUBIN TOTAL: 1.1 mg/dL (ref 0.0–1.2)
BUN / CREAT RATIO: 13 (ref 9–20)
BUN: 10 mg/dL (ref 6–24)
CHLORIDE: 102 mmol/L (ref 96–106)
CO2: 23 mmol/L (ref 20–29)
Calcium: 9.3 mg/dL (ref 8.7–10.2)
Creatinine, Ser: 0.75 mg/dL — ABNORMAL LOW (ref 0.76–1.27)
GFR calc non Af Amer: 104 mL/min/{1.73_m2} (ref >59)
GFR, EST AFRICAN AMERICAN: 120 mL/min/{1.73_m2} (ref >59)
GLUCOSE: 99 mg/dL (ref 65–99)
Globulin, Total: 2.2 g/dL (ref 1.5–4.5)
POTASSIUM: 4.4 mmol/L (ref 3.5–5.2)
Sodium: 140 mmol/L (ref 134–144)
TOTAL PROTEIN: 7 g/dL (ref 6.0–8.5)

## 2017-10-04 LAB — LIPID PANEL
CHOLESTEROL TOTAL: 141 mg/dL (ref 100–199)
Chol/HDL Ratio: 2.7 ratio (ref 0.0–5.0)
HDL: 53 mg/dL (ref >39)
LDL Calculated: 55 mg/dL (ref 0–99)
Triglycerides: 165 mg/dL — ABNORMAL HIGH (ref 0–149)
VLDL CHOLESTEROL CAL: 33 mg/dL (ref 5–40)

## 2017-10-04 LAB — LDL CHOLESTEROL, DIRECT: LDL DIRECT: 65 mg/dL (ref 0–99)

## 2017-10-09 DIAGNOSIS — Z9889 Other specified postprocedural states: Secondary | ICD-10-CM | POA: Insufficient documentation

## 2017-10-17 ENCOUNTER — Ambulatory Visit: Payer: BLUE CROSS/BLUE SHIELD | Admitting: Physical Therapy

## 2017-10-31 ENCOUNTER — Ambulatory Visit: Payer: BLUE CROSS/BLUE SHIELD | Admitting: Physical Therapy

## 2018-01-19 ENCOUNTER — Other Ambulatory Visit: Payer: Self-pay | Admitting: Family Medicine

## 2018-01-19 DIAGNOSIS — I10 Essential (primary) hypertension: Secondary | ICD-10-CM

## 2018-01-19 DIAGNOSIS — E785 Hyperlipidemia, unspecified: Secondary | ICD-10-CM

## 2018-02-07 DIAGNOSIS — I2510 Atherosclerotic heart disease of native coronary artery without angina pectoris: Secondary | ICD-10-CM | POA: Insufficient documentation

## 2018-02-13 ENCOUNTER — Telehealth: Payer: Self-pay | Admitting: Family Medicine

## 2018-02-13 ENCOUNTER — Encounter: Payer: Self-pay | Admitting: Family Medicine

## 2018-02-13 ENCOUNTER — Ambulatory Visit: Payer: BLUE CROSS/BLUE SHIELD | Admitting: Family Medicine

## 2018-02-13 VITALS — BP 113/70 | HR 66 | Temp 98.6°F | Resp 16 | Wt 190.0 lb

## 2018-02-13 DIAGNOSIS — R911 Solitary pulmonary nodule: Secondary | ICD-10-CM

## 2018-02-13 DIAGNOSIS — E041 Nontoxic single thyroid nodule: Secondary | ICD-10-CM

## 2018-02-13 DIAGNOSIS — IMO0001 Reserved for inherently not codable concepts without codable children: Secondary | ICD-10-CM

## 2018-02-13 NOTE — Telephone Encounter (Signed)
Please advise 

## 2018-02-13 NOTE — Telephone Encounter (Signed)
Pt states that a 12mm nodule was found on left lower lobe of lung on test that was done at [facility].He states you needed this information in order to order a CT

## 2018-02-13 NOTE — Progress Notes (Signed)
Patient: Kevin Crawford Male    DOB: 09-14-1960   57 y.o.   MRN: [id number] Visit Date: 02/13/2018  Today's Provider: Lelon Huh, MD   Chief Complaint  Patient presents with  . Hyperlipidemia  . Hypertension   Subjective:    HPI   Hypertension, follow-up:  BP Readings from Last 3 Encounters:  02/13/18 113/70  09/29/17 110/68  09/28/17 113/74    He was last seen for hypertension 4 months ago.  BP at that visit was 110/68. Management since that visit includes; no changes.He reports good compliance with treatment. He is not having side effects.  He is exercising. He is adherent to low salt diet.   Outside blood pressures are not being checked. He is experiencing none.  Patient denies chest pain, chest pressure/discomfort, claudication, dyspnea, exertional chest pressure/discomfort, fatigue, irregular heart beat, lower extremity edema, near-syncope, orthopnea, palpitations, paroxysmal nocturnal dyspnea, syncope and tachypnea.   Cardiovascular risk factors include advanced age (older than 59 for men, 51 for women), hypertension and male gender.  Use of agents associated with hypertension: none.   ------------------------------------------------------------------------    Lipid/Cholesterol, Follow-up:   Last seen for this 4 months ago.  Management since that visit includes; labs checked, no changes.  Last Lipid Panel:    Component Value Date/Time   CHOL 141 10/03/2017 0955   TRIG 165 (H) 10/03/2017 0955   HDL 53 10/03/2017 0955   CHOLHDL 2.7 10/03/2017 0955   LDLCALC 55 10/03/2017 0955   LDLDIRECT 65 10/03/2017 0955    He reports good compliance with treatment. He is not having side effects.   Wt Readings from Last 3 Encounters:  02/13/18 190 lb (86.2 kg)  09/28/17 195 lb (88.5 kg)  06/30/17 201 lb (91.2 kg)    ------------------------------------------------------------------------  Lung nodule < 6cm on CT Incidental finding on CT heart  ordered by his cardiologist in April. He does have history of smoking 1-2 ppd for 22 years, but quit about 14 years ago.   Solitary thyroid nodule From 09/29/2017-Incidental finding on CTA of neck in March.   Allergies  Allergen Reactions  . Phenergan [Promethazine Hcl] Other (See Comments)    SEIZURES  . Mobic [Meloxicam] Other (See Comments)    Severe cramping     Current Outpatient Medications:  .  amLODipine-benazepril (LOTREL) 5-20 MG capsule, TAKE 1 CAPSULE BY MOUTH EVERY DAY, Disp: 90 capsule, Rfl: 3 .  aspirin 81 MG EC tablet, Take 81 mg by mouth daily. Swallow whole., Disp: , Rfl:  .  atorvastatin (LIPITOR) 10 MG tablet, TAKE 1 TABLET BY MOUTH EVERY DAY, Disp: 90 tablet, Rfl: 3 .  Multiple Vitamin (MULTIVITAMIN) tablet, Take 1 tablet by mouth daily., Disp: , Rfl:  .  Omega-3 Fatty Acids (FISH OIL) 1000 MG CAPS, Take 1,500 mg by mouth daily. , Disp: , Rfl:  .  OVER THE COUNTER MEDICATION, Apply 1 application topically daily as needed (for pain). CBD Lotion, Disp: , Rfl:  .  tadalafil (CIALIS) 5 MG tablet, TAKE 1 TABLET BY MOUTH TWICE A WEEK, Disp: 15 tablet, Rfl: 3  Review of Systems  Constitutional: Negative for appetite change, chills and fever.  Respiratory: Negative for chest tightness, shortness of breath and wheezing.   Cardiovascular: Negative for chest pain and palpitations.  Gastrointestinal: Negative for abdominal pain, nausea and vomiting.    Social History   Tobacco Use  . Smoking status: Former Smoker    Last attempt to quit: 06/21/2003    Years  since quitting: 14.6  . Smokeless tobacco: Former Systems developer    Quit date: 06/21/2003  . Tobacco comment: 1-2 ppd for about 25 years  Substance Use Topics  . Alcohol use: Yes    Comment: 5-7 drinks each week; varies   Objective:   BP 113/70 (BP Location: Left Arm, Patient Position: Sitting, Cuff Size: Large)   Pulse 66   Temp 98.6 F (37 C) (Oral)   Resp 16   Wt 190 lb (86.2 kg)   SpO2 97% Comment: room air  BMI  28.06 kg/m  Vitals:   02/13/18 1346  BP: 113/70  Pulse: 66  Resp: 16  Temp: 98.6 F (37 C)  TempSrc: Oral  SpO2: 97%  Weight: 190 lb (86.2 kg)     Physical Exam  General appearance: alert, well developed, well nourished, cooperative and in no distress Head: Normocephalic, without obvious abnormality, atraumatic Respiratory: Respirations even and unlabored, normal respiratory rate Extremities: No gross deformities Skin: Skin color, texture, turgor normal. No rashes seen  Psych: Appropriate mood and affect. Neurologic: Mental status: Alert, oriented to person, place, and time, thought content appropriate.     Assessment & Plan:     1. Solitary thyroid nodule  - TSH - T4, free  2. Lung nodule < 6cm on CT  He needs follow up imaging but he is concerned about insurance coverage. He is going to check with insurance then get back to [place] about scheduling.        Lelon Huh, MD  [facility]

## 2018-02-14 ENCOUNTER — Encounter: Payer: Self-pay | Admitting: Family Medicine

## 2018-02-14 LAB — T4, FREE: Free T4: 1.17 ng/dL (ref 0.82–1.77)

## 2018-02-14 LAB — TSH: TSH: 1.83 u[IU]/mL (ref 0.450–4.500)

## 2018-02-16 NOTE — Telephone Encounter (Signed)
Please advise 

## 2018-02-16 NOTE — Telephone Encounter (Signed)
Patient calling back to check status on this. Asked Parke Poisson and she reports that she is waiting on the order.

## 2018-02-20 ENCOUNTER — Other Ambulatory Visit: Payer: Self-pay | Admitting: Family Medicine

## 2018-02-20 DIAGNOSIS — Z87891 Personal history of nicotine dependence: Secondary | ICD-10-CM

## 2018-02-20 DIAGNOSIS — R911 Solitary pulmonary nodule: Secondary | ICD-10-CM

## 2018-02-20 DIAGNOSIS — IMO0001 Reserved for inherently not codable concepts without codable children: Secondary | ICD-10-CM

## 2018-02-20 DIAGNOSIS — R918 Other nonspecific abnormal finding of lung field: Secondary | ICD-10-CM

## 2018-02-20 NOTE — Telephone Encounter (Signed)
Patient called stating that his insurance wont tell him anything about coverage for the CT for lung nodules until the actual order is placed. Patient is requesting that we place an order for the CT, so that he and Judson Roch will be able to find out if his insurance covers this test.

## 2018-02-20 NOTE — Telephone Encounter (Signed)
Pt called back regarding status of CT for f/u pulmonary nodule.Order has not been placed

## 2018-02-27 ENCOUNTER — Ambulatory Visit: Payer: BLUE CROSS/BLUE SHIELD

## 2018-03-05 ENCOUNTER — Ambulatory Visit
Admission: RE | Admit: 2018-03-05 | Discharge: 2018-03-05 | Disposition: A | Discharge status: Home / Self Care | Payer: BLUE CROSS/BLUE SHIELD | Source: Ambulatory Visit | Attending: Family Medicine | Admitting: Family Medicine

## 2018-03-05 DIAGNOSIS — R918 Other nonspecific abnormal finding of lung field: Secondary | ICD-10-CM | POA: Diagnosis not present

## 2018-03-05 DIAGNOSIS — I2510 Atherosclerotic heart disease of native coronary artery without angina pectoris: Secondary | ICD-10-CM | POA: Diagnosis not present

## 2018-03-05 DIAGNOSIS — R911 Solitary pulmonary nodule: Secondary | ICD-10-CM | POA: Diagnosis not present

## 2018-03-22 DIAGNOSIS — L812 Freckles: Secondary | ICD-10-CM | POA: Diagnosis not present

## 2018-03-27 ENCOUNTER — Encounter: Payer: Self-pay | Admitting: Family Medicine

## 2018-03-27 ENCOUNTER — Ambulatory Visit: Payer: BLUE CROSS/BLUE SHIELD | Admitting: Family Medicine

## 2018-03-27 VITALS — BP 112/75 | HR 94 | Temp 99.2°F | Resp 18 | Wt 189.8 lb

## 2018-03-27 DIAGNOSIS — R05 Cough: Secondary | ICD-10-CM | POA: Diagnosis not present

## 2018-03-27 DIAGNOSIS — B349 Viral infection, unspecified: Secondary | ICD-10-CM

## 2018-03-27 DIAGNOSIS — R059 Cough, unspecified: Secondary | ICD-10-CM

## 2018-03-27 MED ORDER — HYDROCODONE-HOMATROPINE 5-1.5 MG/5ML PO SYRP: 5.0000 mL | ORAL_SOLUTION | Freq: Three times a day (TID) | ORAL | 0 refills | Status: DC | PRN

## 2018-03-27 MED ORDER — BALOXAVIR MARBOXIL(80 MG DOSE) 2 X 40 MG PO TBPK
2.0000 | ORAL_TABLET | Freq: Once | ORAL | 0 refills | Status: AC
Start: 2018-03-27 — End: 2018-03-27

## 2018-03-27 NOTE — Progress Notes (Signed)
Patient: Kevin Crawford Male    DOB: 1960-07-26   57 y.o.   MRN: [id number] Visit Date: 03/27/2018  Today's Provider: Lelon Huh, MD   Chief Complaint  Patient presents with  . URI   Subjective:    URI   This is a new problem. Episode onset: Saturday. The problem has been gradually worsening. The maximum temperature recorded prior to his arrival was 102 - 102.9 F (99.6 this morning). The fever has been present for less than 1 day. Associated symptoms include coughing. Sore throat: scratchy throat. Associated symptoms comments: Chills and body aches- resolved now . Treatments tried: Coricidin. The treatment provided mild relief.  He reports sx started as sore throat, cough and fever over 102 on 03-24-2018. Since then ST has improved and cough has worsened. Sometimes productive small amount clear phlegm.  Fever last night around 101. Taking OCT Coricidin and Ricolla. Denies dyspnea.   He has not yet had a flu vaccine this year.    Allergies  Allergen Reactions  . Phenergan [Promethazine Hcl] Other (See Comments)    SEIZURES  . Mobic [Meloxicam] Other (See Comments)    Severe cramping     Current Outpatient Medications:  .  amLODipine-benazepril (LOTREL) 5-20 MG capsule, TAKE 1 CAPSULE BY MOUTH EVERY DAY, Disp: 90 capsule, Rfl: 3 .  aspirin 81 MG EC tablet, Take 81 mg by mouth daily. Swallow whole., Disp: , Rfl:  .  atorvastatin (LIPITOR) 10 MG tablet, TAKE 1 TABLET BY MOUTH EVERY DAY, Disp: 90 tablet, Rfl: 3 .  Multiple Vitamin (MULTIVITAMIN) tablet, Take 1 tablet by mouth daily., Disp: , Rfl:  .  Omega-3 Fatty Acids (FISH OIL) 1000 MG CAPS, Take 1,500 mg by mouth daily. , Disp: , Rfl:  .  OVER THE COUNTER MEDICATION, Apply 1 application topically daily as needed (for pain). CBD Lotion, Disp: , Rfl:  .  tadalafil (CIALIS) 5 MG tablet, TAKE 1 TABLET BY MOUTH TWICE A WEEK, Disp: 15 tablet, Rfl: 3  Review of Systems  Constitutional: Negative.   HENT: Negative.  Sore  throat: scratchy throat.   Respiratory: Positive for cough.   Cardiovascular: Negative.   Musculoskeletal: Negative.     Social History   Tobacco Use  . Smoking status: Former Smoker    Last attempt to quit: 06/21/2003    Years since quitting: 14.7  . Smokeless tobacco: Former Systems developer    Quit date: 06/21/2003  . Tobacco comment: 1-2 ppd for about 25 years  Substance Use Topics  . Alcohol use: Yes    Comment: 5-7 drinks each week; varies   Objective:   BP 112/75 (BP Location: Right Arm, Patient Position: Sitting, Cuff Size: Normal)   Pulse 94   Temp 99.2 F (37.3 C) (Oral)   Resp 18   Wt 189 lb 12.8 oz (86.1 kg)   SpO2 96%   BMI 28.03 kg/m  Vitals:   03/27/18 1001  BP: 112/75  Pulse: 94  Resp: 18  Temp: 99.2 F (37.3 C)  TempSrc: Oral  SpO2: 96%  Weight: 189 lb 12.8 oz (86.1 kg)     Physical Exam  General Appearance:    Alert, cooperative, no distress  HENT:   ENT exam normal, no neck nodes or sinus tenderness  Eyes:    PERRL, conjunctiva/corneas clear, EOM's intact       Lungs:     Clear to auscultation bilaterally, respirations unlabored  Heart:    Regular rate and rhythm  Neurologic:   Awake, alert, oriented x 3. No apparent focal neurological           defect.           Assessment & Plan:     1. Cough  - HYDROcodone-homatropine (HYCODAN) 5-1.5 MG/5ML syrup; Take 5 mLs by mouth every 8 (eight) hours as needed for cough.  Dispense: 120 mL; Refill: 0  2. Viral syndrome C/w influenza, although we no POC flu tests available. Will treat empirically with samples. - Baloxavir Marboxil,80 MG Dose, (XOFLUZA) 2 x 40 MG TBPK; Take 2 tablets by mouth once for 1 dose.  Dispense: 2 each; Refill: 0  Counseled regarding signs and symptoms of viral and bacterial respiratory infections. Advised to call or return for additional evaluation if he develops any sign of bacterial infection, or if current symptoms last longer than 10 days.         Lelon Huh, MD  [facility]

## 2018-04-30 ENCOUNTER — Other Ambulatory Visit: Payer: Self-pay | Admitting: Family Medicine

## 2018-04-30 MED ORDER — TADALAFIL 5 MG PO TABS: ORAL_TABLET | ORAL | 3 refills | Status: DC

## 2018-04-30 NOTE — Telephone Encounter (Signed)
Pt requesting a refill on: tadalafil (CIALIS) 5 MG tablet  Please fill at: [facility] Address: [address] [place], [place], [place] [address] Phone: [phone]   Thanks, [facility]

## 2018-05-26 ENCOUNTER — Other Ambulatory Visit: Payer: Self-pay | Admitting: Family Medicine

## 2018-06-07 ENCOUNTER — Ambulatory Visit: Payer: 59 | Admitting: Physician Assistant

## 2018-06-07 ENCOUNTER — Encounter: Payer: Self-pay | Admitting: Physician Assistant

## 2018-06-07 VITALS — BP 117/82 | HR 72 | Temp 98.0°F | Resp 16 | Wt 186.0 lb

## 2018-06-07 DIAGNOSIS — M67912 Unspecified disorder of synovium and tendon, left shoulder: Secondary | ICD-10-CM

## 2018-06-07 NOTE — Progress Notes (Signed)
Patient: Kevin Crawford Male    DOB: April 03, 1961   57 y.o.   MRN: [id number] Visit Date: 06/08/2018  Today's Provider: Trinna Post, PA-C   Chief Complaint  Patient presents with  . Shoulder Pain   Subjective:    Shoulder Pain   The pain is present in the neck and left shoulder. This is a chronic problem. There has been no history of extremity trauma. The problem occurs constantly. The problem has been gradually worsening. The quality of the pain is described as aching, dull and burning. The pain is at a severity of 2/10 (at 3 am this morning it was 8/10). The pain is moderate. Associated symptoms include an inability to bear weight ("some"), a limited range of motion, numbness, stiffness and tingling. Associated symptoms comments: It hurts when he moves it toward the back or lifting his arm. The symptoms are aggravated by activity. He has tried heat and OTC ointments (CBD) for the symptoms.   Had tendinopathy and bursitis in right shoulder 5 years ago and underwent arthroscopic surgery for it. He reports this feels similar.       Allergies  Allergen Reactions  . Phenergan [Promethazine Hcl] Other (See Comments)    SEIZURES  . Mobic [Meloxicam] Other (See Comments)    Severe cramping     Current Outpatient Medications:  .  amLODipine-benazepril (LOTREL) 5-20 MG capsule, TAKE 1 CAPSULE BY MOUTH EVERY DAY, Disp: 90 capsule, Rfl: 3 .  aspirin 81 MG EC tablet, Take 81 mg by mouth daily. Swallow whole., Disp: , Rfl:  .  atorvastatin (LIPITOR) 10 MG tablet, TAKE 1 TABLET BY MOUTH EVERY DAY, Disp: 90 tablet, Rfl: 3 .  Multiple Vitamin (MULTIVITAMIN) tablet, Take 1 tablet by mouth daily., Disp: , Rfl:  .  Omega-3 Fatty Acids (FISH OIL) 1000 MG CAPS, Take 1,500 mg by mouth daily. , Disp: , Rfl:  .  OVER THE COUNTER MEDICATION, Apply 1 application topically daily as needed (for pain). CBD Lotion, Disp: , Rfl:  .  tadalafil (CIALIS) 5 MG tablet, TAKE 1 TABLET BY MOUTH TWICE A  WEEK, Disp: 15 tablet, Rfl: 3  Review of Systems  HENT: Negative.   Musculoskeletal: Positive for arthralgias and stiffness.  Neurological: Positive for tingling and numbness.    Social History   Tobacco Use  . Smoking status: Former Smoker    Last attempt to quit: 06/21/2003    Years since quitting: 14.9  . Smokeless tobacco: Former Systems developer    Quit date: 06/21/2003  . Tobacco comment: 1-2 ppd for about 25 years  Substance Use Topics  . Alcohol use: Yes    Comment: 5-7 drinks each week; varies      Objective:   BP 117/82 (BP Location: Right Arm, Patient Position: Sitting, Cuff Size: Large)   Pulse 72   Temp 98 F (36.7 C) (Oral)   Resp 16   Wt 186 lb (84.4 kg)   BMI 27.47 kg/m  Vitals:   06/07/18 1042  BP: 117/82  Pulse: 72  Resp: 16  Temp: 98 F (36.7 C)  TempSrc: Oral  Weight: 186 lb (84.4 kg)     Physical Exam Constitutional:      Appearance: Normal appearance.  Neck:     Musculoskeletal: Normal range of motion and neck supple. No muscular tenderness.  Musculoskeletal:        General: No tenderness.     Comments: Difficulty with belly press and lift off on left side.  Neers and Hawkins positive. 5/5 strength bilateral upper extremities.  Skin:    General: Skin is warm and dry.  Neurological:     Mental Status: He is alert.     Motor: Motor function is intact. No weakness, tremor or atrophy.  Psychiatric:        Mood and Affect: Mood normal.        Behavior: Behavior normal.         Assessment & Plan    1. Tendinopathy of left rotator cuff  Printed out some exercises and refer to PT. Short course of anti-inflammatories. If unsuccessful, he can get left shoulder xray and return to clinic for injection.  - Ambulatory referral to Physical Therapy  The entirety of the information documented in the History of Present Illness, Review of Systems and Physical Exam were personally obtained by me. Portions of this information were initially documented by  Lyndel Pleasure, CMA and reviewed by me for thoroughness and accuracy.         Trinna Post, PA-C  [facility]

## 2018-06-07 NOTE — Patient Instructions (Signed)
Can take advil PRN for pain. If not improving, call back we can try steroid injection.    Rotator Cuff Tendinitis  Rotator cuff tendinitis is inflammation of the tough, cord-like bands that connect muscle to bone (tendons) in the rotator cuff. The rotator cuff includes all of the muscles and tendons that connect the arm to the shoulder. The rotator cuff holds the head of the upper arm bone (humerus) in the cup (fossa) of the shoulder blade (scapula). This condition can lead to a long-lasting (chronic) tear. The tear may be partial or complete. What are the causes? This condition is usually caused by overusing the rotator cuff. What increases the risk? This condition is more likely to develop in athletes and workers who frequently use their shoulder or reach over their heads. This can include activities such as:  Tennis.  Baseball or softball.  Swimming.  Construction work.  Painting. What are the signs or symptoms? Symptoms of this condition include:  Pain spreading (radiating) from the shoulder to the upper arm.  Swelling and tenderness in front of the shoulder.  Pain when reaching, pulling, or lifting the arm above the head.  Pain when lowering the arm from above the head.  Minor pain in the shoulder when resting.  Increased pain in the shoulder at night.  Difficulty placing the arm behind the back. How is this diagnosed? This condition is diagnosed with a medical history and physical exam. Tests may also be done, including:  X-rays.  MRI.  Ultrasounds.  CT or MR arthrogram. During this test, a contrast material is injected and then images are taken. How is this treated? Treatment for this condition depends on the severity of the condition. In less severe cases, treatment may include:  Rest. This may be done with a sling that holds the shoulder still (immobilization). Your health care provider may also recommend avoiding activities that involve lifting your arm  over your head.  Icing the shoulder.  Anti-inflammatory medicines, such as aspirin or ibuprofen. In more severe cases, treatment may include:  Physical therapy.  Steroid injections.  Surgery. Follow these instructions at home: If you have a sling:  Wear the sling as told by your health care provider. Remove it only as told by your health care provider.  Loosen the sling if your fingers tingle, become numb, or turn cold and blue.  Keep the sling clean.  If the sling is not waterproof, do not let it get wet. Remove it, if allowed, or cover it with a watertight covering when you take a bath or shower. Managing pain, stiffness, and swelling  If directed, put ice on the injured area. ? If you have a removable sling, remove it as told by your health care provider. ? Put ice in a plastic bag. ? Place a towel between your skin and the bag. ? Leave the ice on for 20 minutes, 2-3 times a day.  Move your fingers often to avoid stiffness and to lessen swelling.  Raise (elevate) the injured area above the level of your heart while you are lying down.  Find a comfortable sleeping position or sleep on a recliner, if available. Driving  Do not drive or use heavy machinery while taking prescription pain medicine.  Ask your health care provider when it is safe to drive if you have a sling on your arm. Activity  Rest your shoulder as told by your health care provider.  Return to your normal activities as told by your health care  provider. Ask your health care provider what activities are safe for you.  Do any exercises or stretches as told by your health care provider.  If you do repetitive overhead tasks, take small breaks in between and include stretching exercises as told by your health care provider. General instructions  Do not use any products that contain nicotine or tobacco, such as cigarettes and e-cigarettes. These can delay healing. If you need help quitting, ask your  health care provider.  Take over-the-counter and prescription medicines only as told by your health care provider.  Keep all follow-up visits as told by your health care provider. This is important. Contact a health care provider if:  Your pain gets worse.  You have new pain in your arm, hands, or fingers.  Your pain is not relieved with medicine or does not get better after 6 weeks of treatment.  You have cracking sensations when moving your shoulder in certain directions.  You hear a snapping sound after using your shoulder, followed by severe pain and weakness. Get help right away if:  Your arm, hand, or fingers are numb or tingling.  Your arm, hand, or fingers are swollen or painful or they turn white or blue. Summary  Rotator cuff tendinitis is inflammation of the tough, cord-like bands that connect muscle to bone (tendons) in the rotator cuff.  This condition is usually caused by overusing the rotator cuff, which includes all of the muscles and tendons that connect the arm to the shoulder.  This condition is more likely to develop in athletes and workers who frequently use their shoulder or reach over their heads.  Treatment generally includes rest, anti-inflammatory medicines, and icing. In some cases, physical therapy and steroid injections may be needed. In severe cases, surgery may be needed. This information is not intended to replace advice given to you by your health care provider. Make sure you discuss any questions you have with your health care provider. Document Released: 08/27/2003 Document Revised: 05/23/2016 Document Reviewed: 05/23/2016 Elsevier Interactive Patient Education  2019 [employer].

## 2018-06-25 ENCOUNTER — Ambulatory Visit: Payer: 59

## 2018-06-27 ENCOUNTER — Ambulatory Visit: Payer: 59

## 2018-07-02 ENCOUNTER — Ambulatory Visit: Payer: 59

## 2018-07-17 ENCOUNTER — Encounter: Payer: Self-pay | Admitting: Family Medicine

## 2018-07-17 ENCOUNTER — Ambulatory Visit: Payer: 59 | Admitting: Family Medicine

## 2018-07-17 VITALS — BP 124/80 | HR 80 | Temp 98.4°F | Resp 16 | Ht 69.0 in | Wt 187.0 lb

## 2018-07-17 DIAGNOSIS — I2510 Atherosclerotic heart disease of native coronary artery without angina pectoris: Secondary | ICD-10-CM | POA: Diagnosis not present

## 2018-07-17 DIAGNOSIS — E785 Hyperlipidemia, unspecified: Secondary | ICD-10-CM

## 2018-07-17 DIAGNOSIS — Z125 Encounter for screening for malignant neoplasm of prostate: Secondary | ICD-10-CM

## 2018-07-17 DIAGNOSIS — I10 Essential (primary) hypertension: Secondary | ICD-10-CM | POA: Diagnosis not present

## 2018-07-17 DIAGNOSIS — N529 Male erectile dysfunction, unspecified: Secondary | ICD-10-CM

## 2018-07-17 MED ORDER — TADALAFIL 5 MG PO TABS: ORAL_TABLET | ORAL | 11 refills | Status: DC

## 2018-07-17 NOTE — Progress Notes (Signed)
Patient: Kevin Crawford Male    DOB: [birth year]   58 y.o.   MRN: [id number] Visit Date: 07/17/2018  Today's Provider: Lelon Huh, MD   Chief Complaint  Patient presents with  . Hypertension  . Hyperlipidemia   Subjective:     HPI    Hypertension, follow-up:  BP Readings from Last 3 Encounters:  07/17/18 124/80  06/07/18 117/82  03/27/18 112/75    He was last seen for hypertension 9 months ago.  BP at that visit was 110/68. Management since that visit includes No changes He reports excellent compliance with treatment. He is not having side effects.  He is exercising. He is adherent to low salt diet.   Outside blood pressures are 120/80's. He is experiencing none.  Patient denies chest pain, fatigue, lower extremity edema, near-syncope, palpitations and syncope.   Cardiovascular risk factors include advanced age (older than 63 for men, 38 for women), dyslipidemia, hypertension and male gender.  Use of agents associated with hypertension: none.   ------------------------------------------------------------------------    Lipid/Cholesterol, Follow-up:   Last seen for this 9 months ago.  Management since that visit includes no changes.  Last Lipid Panel:    Component Value Date/Time   CHOL 141 10/03/2017 0955   TRIG 165 (H) 10/03/2017 0955   HDL 53 10/03/2017 0955   CHOLHDL 2.7 10/03/2017 0955   LDLCALC 55 10/03/2017 0955   LDLDIRECT 65 10/03/2017 0955    He reports excellent compliance with treatment. He is not having side effects.   Wt Readings from Last 3 Encounters:  07/17/18 187 lb (84.8 kg)  06/07/18 186 lb (84.4 kg)  03/27/18 189 lb 12.8 oz (86.1 kg)    ------------------------------------------------------------------------    Allergies  Allergen Reactions  . Phenergan [Promethazine Hcl] Other (See Comments)    SEIZURES  . Mobic [Meloxicam] Other (See Comments)    Severe cramping     Current Outpatient Medications:  .   amLODipine-benazepril (LOTREL) 5-20 MG capsule, TAKE 1 CAPSULE BY MOUTH EVERY DAY, Disp: 90 capsule, Rfl: 3 .  aspirin 81 MG EC tablet, Take 81 mg by mouth daily. Swallow whole., Disp: , Rfl:  .  atorvastatin (LIPITOR) 10 MG tablet, TAKE 1 TABLET BY MOUTH EVERY DAY, Disp: 90 tablet, Rfl: 3 .  Multiple Vitamin (MULTIVITAMIN) tablet, Take 1 tablet by mouth daily., Disp: , Rfl:  .  Omega-3 Fatty Acids (FISH OIL) 1000 MG CAPS, Take 1,000 mg by mouth daily. , Disp: , Rfl:  .  OVER THE COUNTER MEDICATION, Apply 1 application topically daily as needed (for pain). CBD Lotion, Disp: , Rfl:  .  tadalafil (CIALIS) 5 MG tablet, TAKE 1 TABLET BY MOUTH TWICE A WEEK, Disp: 15 tablet, Rfl: 3  Review of Systems  Constitutional: Negative.   Respiratory: Negative.   Cardiovascular: Negative.   Gastrointestinal: Negative.   Musculoskeletal: Negative.   Neurological: Negative for dizziness, light-headedness, numbness and headaches.    Social History   Tobacco Use  . Smoking status: Former Smoker    Last attempt to quit: 06/21/2003    Years since quitting: 15.0  . Smokeless tobacco: Former Systems developer    Quit date: 06/21/2003  . Tobacco comment: 1-2 ppd for about 25 years  Substance Use Topics  . Alcohol use: Yes    Comment: 5-7 drinks each week; varies      Objective:   BP 124/80 (BP Location: Right Arm, Patient Position: Sitting, Cuff Size: Normal)   Pulse 80   Temp  98.4 F (36.9 C) (Oral)   Ht 5\' 9"  (1.753 m)   Wt 187 lb (84.8 kg)   SpO2 (!) 16%   BMI 27.62 kg/m     Physical Exam  General appearance: alert, well developed, well nourished, cooperative and in no distress Head: Normocephalic, without obvious abnormality, atraumatic Respiratory: Respirations even and unlabored, normal respiratory rate Extremities: No gross deformities Skin: Skin color, texture, turgor normal. No rashes seen  Psych: Appropriate mood and affect. Neurologic: Mental status: Alert, oriented to person, place, and time,  thought content appropriate.     Assessment & Plan    1. Hyperlipidemia, unspecified hyperlipidemia type He is tolerating atorvastatin well with no adverse effects.  Consider increasing atorvastatin due to CAD as below.  - Comprehensive metabolic panel - Lipid panel - Direct LDL  2. Essential hypertension Well controlled.  Continue current medications.   - Comprehensive metabolic panel  3. Coronary artery disease involving native coronary artery of native heart without angina pectoris Asymptomatic. Compliant with medication.  Continue aggressive risk factor modification.  Has upcoming follow up with Dr. Saralyn Pilar. Has been on atorvastatin for many years. Consider increasing if LDL is not well below 70.   4. Erectile dysfunction, unspecified erectile dysfunction type refill- tadalafil (CIALIS) 5 MG tablet; One tablet daily  Dispense: 30 tablet; Refill: 11  5. Prostate cancer screening  - PSA  Will send 90 day prescription to [place] after reviewing labs.     Lelon Huh, MD  [facility]

## 2018-07-17 NOTE — Patient Instructions (Addendum)
.   Please review the attached list of medications and notify my office if there are any errors.   . Please bring all of your medications to every appointment so we can make sure that our medication list is the same as yours.   . You can install the GoodRx app on your smart phone to find the lowest prices for generic medications.   

## 2018-07-18 ENCOUNTER — Other Ambulatory Visit: Payer: Self-pay | Admitting: Family Medicine

## 2018-07-18 ENCOUNTER — Ambulatory Visit: Payer: 59

## 2018-07-18 DIAGNOSIS — E785 Hyperlipidemia, unspecified: Secondary | ICD-10-CM

## 2018-07-18 DIAGNOSIS — I10 Essential (primary) hypertension: Secondary | ICD-10-CM

## 2018-07-18 LAB — COMPREHENSIVE METABOLIC PANEL
ALBUMIN: 4.7 g/dL (ref 3.8–4.9)
ALT: 29 IU/L (ref 0–44)
AST: 17 IU/L (ref 0–40)
Albumin/Globulin Ratio: 2 (ref 1.2–2.2)
Alkaline Phosphatase: 54 IU/L (ref 39–117)
BUN / CREAT RATIO: 16 (ref 9–20)
BUN: 15 mg/dL (ref 6–24)
Bilirubin Total: 0.9 mg/dL (ref 0.0–1.2)
CALCIUM: 9.5 mg/dL (ref 8.7–10.2)
CO2: 23 mmol/L (ref 20–29)
CREATININE: 0.92 mg/dL (ref 0.76–1.27)
Chloride: 103 mmol/L (ref 96–106)
GFR, EST AFRICAN AMERICAN: 108 mL/min/{1.73_m2} (ref >59)
GFR, EST NON AFRICAN AMERICAN: 93 mL/min/{1.73_m2} (ref >59)
Globulin, Total: 2.4 g/dL (ref 1.5–4.5)
Glucose: 108 mg/dL — ABNORMAL HIGH (ref 65–99)
Potassium: 4.5 mmol/L (ref 3.5–5.2)
Sodium: 141 mmol/L (ref 134–144)
TOTAL PROTEIN: 7.1 g/dL (ref 6.0–8.5)

## 2018-07-18 LAB — LIPID PANEL
CHOLESTEROL TOTAL: 134 mg/dL (ref 100–199)
Chol/HDL Ratio: 2 ratio (ref 0.0–5.0)
HDL: 68 mg/dL (ref >39)
LDL CALC: 51 mg/dL (ref 0–99)
Triglycerides: 76 mg/dL (ref 0–149)
VLDL CHOLESTEROL CAL: 15 mg/dL (ref 5–40)

## 2018-07-18 LAB — PSA: PROSTATE SPECIFIC AG, SERUM: 0.3 ng/mL (ref 0.0–4.0)

## 2018-07-18 LAB — LDL CHOLESTEROL, DIRECT: LDL DIRECT: 58 mg/dL (ref 0–99)

## 2018-07-18 MED ORDER — AMLODIPINE BESY-BENAZEPRIL HCL 5-20 MG PO CAPS: 1.0000 | ORAL_CAPSULE | Freq: Every day | ORAL | 3 refills | Status: DC

## 2018-07-18 MED ORDER — ATORVASTATIN CALCIUM 10 MG PO TABS: 10.0000 mg | ORAL_TABLET | Freq: Every day | ORAL | 3 refills | Status: DC

## 2018-07-23 ENCOUNTER — Ambulatory Visit: Payer: 59

## 2018-07-24 DIAGNOSIS — E7800 Pure hypercholesterolemia, unspecified: Secondary | ICD-10-CM | POA: Diagnosis not present

## 2018-07-24 DIAGNOSIS — Z9889 Other specified postprocedural states: Secondary | ICD-10-CM | POA: Diagnosis not present

## 2018-07-24 DIAGNOSIS — I10 Essential (primary) hypertension: Secondary | ICD-10-CM | POA: Diagnosis not present

## 2018-07-24 DIAGNOSIS — I2510 Atherosclerotic heart disease of native coronary artery without angina pectoris: Secondary | ICD-10-CM | POA: Diagnosis not present

## 2018-07-25 ENCOUNTER — Ambulatory Visit: Payer: 59

## 2018-07-25 DIAGNOSIS — Z1283 Encounter for screening for malignant neoplasm of skin: Secondary | ICD-10-CM | POA: Diagnosis not present

## 2018-07-25 DIAGNOSIS — L570 Actinic keratosis: Secondary | ICD-10-CM | POA: Diagnosis not present

## 2018-07-25 DIAGNOSIS — L814 Other melanin hyperpigmentation: Secondary | ICD-10-CM | POA: Diagnosis not present

## 2018-07-25 DIAGNOSIS — D229 Melanocytic nevi, unspecified: Secondary | ICD-10-CM | POA: Diagnosis not present

## 2018-08-14 ENCOUNTER — Ambulatory Visit: Payer: 59

## 2018-08-31 ENCOUNTER — Other Ambulatory Visit: Payer: Self-pay | Admitting: Family Medicine

## 2018-08-31 DIAGNOSIS — N529 Male erectile dysfunction, unspecified: Secondary | ICD-10-CM

## 2018-09-10 ENCOUNTER — Ambulatory Visit: Payer: 59

## 2018-09-13 ENCOUNTER — Ambulatory Visit: Payer: 59

## 2018-09-17 ENCOUNTER — Ambulatory Visit: Payer: 59 | Attending: Physician Assistant

## 2018-10-05 MED FILL — AMLODIPINE-BENAZEPRIL 5-20: 5-20 | 90 days supply | Qty: 90 | Fill #0

## 2018-10-05 MED FILL — ATORVASTATIN 10 MG TABLET: 10 | 90 days supply | Qty: 90 | Fill #0

## 2018-10-10 DIAGNOSIS — Z86010 Personal history of colonic polyps: Secondary | ICD-10-CM | POA: Diagnosis not present

## 2018-10-10 DIAGNOSIS — K760 Fatty (change of) liver, not elsewhere classified: Secondary | ICD-10-CM | POA: Diagnosis not present

## 2018-10-10 DIAGNOSIS — K625 Hemorrhage of anus and rectum: Secondary | ICD-10-CM | POA: Diagnosis not present

## 2018-10-10 DIAGNOSIS — R1011 Right upper quadrant pain: Secondary | ICD-10-CM | POA: Diagnosis not present

## 2018-10-11 ENCOUNTER — Other Ambulatory Visit: Payer: Self-pay | Admitting: Gastroenterology

## 2018-10-11 DIAGNOSIS — R1011 Right upper quadrant pain: Secondary | ICD-10-CM

## 2018-10-31 ENCOUNTER — Ambulatory Visit
Admission: RE | Admit: 2018-10-31 | Discharge: 2018-10-31 | Disposition: A | Discharge status: Home / Self Care | Payer: 59 | Source: Ambulatory Visit | Attending: Gastroenterology | Admitting: Gastroenterology

## 2018-10-31 ENCOUNTER — Other Ambulatory Visit: Payer: Self-pay

## 2018-10-31 DIAGNOSIS — K760 Fatty (change of) liver, not elsewhere classified: Secondary | ICD-10-CM | POA: Diagnosis not present

## 2018-10-31 DIAGNOSIS — R1011 Right upper quadrant pain: Secondary | ICD-10-CM | POA: Diagnosis not present

## 2018-11-01 ENCOUNTER — Ambulatory Visit: Payer: 59

## 2018-11-22 ENCOUNTER — Ambulatory Visit: Payer: 59

## 2018-12-10 ENCOUNTER — Ambulatory Visit: Payer: 59

## 2018-12-12 ENCOUNTER — Ambulatory Visit: Payer: 59

## 2019-01-23 DIAGNOSIS — R079 Chest pain, unspecified: Secondary | ICD-10-CM | POA: Diagnosis not present

## 2019-01-23 DIAGNOSIS — I10 Essential (primary) hypertension: Secondary | ICD-10-CM | POA: Diagnosis not present

## 2019-01-23 DIAGNOSIS — E7800 Pure hypercholesterolemia, unspecified: Secondary | ICD-10-CM | POA: Diagnosis not present

## 2019-01-23 DIAGNOSIS — I2510 Atherosclerotic heart disease of native coronary artery without angina pectoris: Secondary | ICD-10-CM | POA: Diagnosis not present

## 2019-01-25 ENCOUNTER — Other Ambulatory Visit: Payer: Self-pay | Admitting: Cardiology

## 2019-01-25 DIAGNOSIS — I10 Essential (primary) hypertension: Secondary | ICD-10-CM

## 2019-01-25 DIAGNOSIS — I2510 Atherosclerotic heart disease of native coronary artery without angina pectoris: Secondary | ICD-10-CM

## 2019-01-25 DIAGNOSIS — R079 Chest pain, unspecified: Secondary | ICD-10-CM

## 2019-01-31 ENCOUNTER — Other Ambulatory Visit: Payer: Self-pay

## 2019-01-31 ENCOUNTER — Ambulatory Visit
Admission: RE | Admit: 2019-01-31 | Discharge: 2019-01-31 | Disposition: A | Discharge status: Home / Self Care | Payer: 59 | Source: Ambulatory Visit | Attending: Cardiology | Admitting: Cardiology

## 2019-01-31 DIAGNOSIS — I10 Essential (primary) hypertension: Secondary | ICD-10-CM | POA: Insufficient documentation

## 2019-01-31 DIAGNOSIS — R079 Chest pain, unspecified: Secondary | ICD-10-CM | POA: Insufficient documentation

## 2019-01-31 DIAGNOSIS — I2510 Atherosclerotic heart disease of native coronary artery without angina pectoris: Secondary | ICD-10-CM | POA: Diagnosis not present

## 2019-01-31 LAB — NM MYOCAR MULTI W/SPECT W/WALL MOTION / EF
LV dias vol: 82 mL (ref 62–150)
LV sys vol: 28 mL
Peak HR: 104 {beats}/min
Rest HR: 71 {beats}/min
SDS: 1
SRS: 9
SSS: 0
TID: 1.13

## 2019-01-31 MED ORDER — REGADENOSON 0.4 MG/5ML IV SOLN
0.4000 mg | Freq: Once | INTRAVENOUS | Status: AC
  Administered 2019-01-31: 0.4 mg via INTRAVENOUS

## 2019-01-31 MED ORDER — TECHNETIUM TC 99M TETROFOSMIN IV KIT
10.2970 | PACK | Freq: Once | INTRAVENOUS | Status: AC | PRN
  Administered 2019-01-31: 10.297 via INTRAVENOUS

## 2019-01-31 MED ORDER — TECHNETIUM TC 99M TETROFOSMIN IV KIT
31.1600 | PACK | Freq: Once | INTRAVENOUS | Status: AC | PRN
  Administered 2019-01-31: 31.16 via INTRAVENOUS

## 2019-02-06 DIAGNOSIS — I10 Essential (primary) hypertension: Secondary | ICD-10-CM | POA: Diagnosis not present

## 2019-02-06 DIAGNOSIS — E7800 Pure hypercholesterolemia, unspecified: Secondary | ICD-10-CM | POA: Diagnosis not present

## 2019-02-06 DIAGNOSIS — R079 Chest pain, unspecified: Secondary | ICD-10-CM | POA: Diagnosis not present

## 2019-02-06 DIAGNOSIS — I2510 Atherosclerotic heart disease of native coronary artery without angina pectoris: Secondary | ICD-10-CM | POA: Diagnosis not present

## 2019-04-29 DIAGNOSIS — K625 Hemorrhage of anus and rectum: Secondary | ICD-10-CM | POA: Diagnosis not present

## 2019-04-29 DIAGNOSIS — K760 Fatty (change of) liver, not elsewhere classified: Secondary | ICD-10-CM | POA: Diagnosis not present

## 2019-04-29 DIAGNOSIS — Z86010 Personal history of colonic polyps: Secondary | ICD-10-CM | POA: Diagnosis not present

## 2019-04-29 DIAGNOSIS — K644 Residual hemorrhoidal skin tags: Secondary | ICD-10-CM | POA: Diagnosis not present

## 2019-04-30 ENCOUNTER — Encounter: Payer: Self-pay | Admitting: Family Medicine

## 2019-04-30 ENCOUNTER — Other Ambulatory Visit: Payer: Self-pay

## 2019-04-30 ENCOUNTER — Ambulatory Visit (INDEPENDENT_AMBULATORY_CARE_PROVIDER_SITE_OTHER): Payer: 59 | Admitting: Family Medicine

## 2019-04-30 VITALS — BP 124/79 | HR 73 | Temp 97.1°F | Wt 204.6 lb

## 2019-04-30 DIAGNOSIS — R1032 Left lower quadrant pain: Secondary | ICD-10-CM

## 2019-04-30 DIAGNOSIS — E041 Nontoxic single thyroid nodule: Secondary | ICD-10-CM | POA: Diagnosis not present

## 2019-04-30 DIAGNOSIS — IMO0001 Reserved for inherently not codable concepts without codable children: Secondary | ICD-10-CM

## 2019-04-30 DIAGNOSIS — R918 Other nonspecific abnormal finding of lung field: Secondary | ICD-10-CM

## 2019-04-30 DIAGNOSIS — E785 Hyperlipidemia, unspecified: Secondary | ICD-10-CM | POA: Diagnosis not present

## 2019-04-30 DIAGNOSIS — I10 Essential (primary) hypertension: Secondary | ICD-10-CM

## 2019-04-30 DIAGNOSIS — K625 Hemorrhage of anus and rectum: Secondary | ICD-10-CM

## 2019-04-30 DIAGNOSIS — R1013 Epigastric pain: Secondary | ICD-10-CM | POA: Diagnosis not present

## 2019-04-30 DIAGNOSIS — R911 Solitary pulmonary nodule: Secondary | ICD-10-CM | POA: Diagnosis not present

## 2019-04-30 LAB — POCT URINALYSIS DIPSTICK
Bilirubin, UA: NEGATIVE
Blood, UA: NEGATIVE
Glucose, UA: NEGATIVE
Ketones, UA: NEGATIVE
Leukocytes, UA: NEGATIVE
Nitrite, UA: NEGATIVE
Protein, UA: NEGATIVE
Spec Grav, UA: 1.01 (ref 1.010–1.025)
Urobilinogen, UA: 0.2 E.U./dL
pH, UA: 7.5 (ref 5.0–8.0)

## 2019-04-30 NOTE — Progress Notes (Signed)
t      Patient: Kevin Crawford Male    DOB: June 19, 1961   58 y.o.   MRN: [id number] Visit Date: 04/30/2019  Today's Provider: Lelon Huh, MD   Chief Complaint  Patient presents with  . Hypertension  . Hyperlipidemia   Subjective:     HPI  Patient states he seen GI yesterday and they requested additional labs. Has been having epigastric and LLQ pain with blood in stool. Is scheduled for colonoscopy in January, but Dr. Alice Reichert recommended checking cbc, sed rate, crp, and h. Pylori breath test.    Hypertension, follow-up:     BP Readings from Last 3 Encounters:  07/17/18 124/80  06/07/18 117/82  03/27/18 112/75    He was last seen for hypertension 9 months ago.  BP at that visit was 124/80. Management since that visit includes no changes He reports excellent compliance with treatment. He is not having side effects.  He is not exercising. He is adherent to low salt diet.   Outside blood pressures are 120/80's. He is experiencing none.  Patient denies chest pain, fatigue, lower extremity edema, near-syncope, palpitations and syncope.   Cardiovascular risk factors include advanced age (older than 24 for men, 60 for women), dyslipidemia, hypertension and male gender.  Use of agents associated with hypertension: none.   He has put on significant weight since Covid pandemic started and states he had some trouble with his blood sugar years ago when he was this heavy   Wt Readings from Last 3 Encounters:  04/30/19 204 lb 9.6 oz (92.8 kg)  07/17/18 187 lb (84.8 kg)  06/07/18 186 lb (84.4 kg)    ------------------------------------------------------------------------   Lipid/Cholesterol, Follow-up:   Last seen for this 9 months ago.  Management since that visit includes no changes.  Last Lipid Panel: Lab Results  Component Value Date   CHOL 134 07/17/2018   HDL 68 07/17/2018   LDLCALC 51 07/17/2018   LDLDIRECT 58 07/17/2018   TRIG 76 07/17/2018   CHOLHDL  2.0 07/17/2018   He reports excellent compliance with treatment. He is not having side effects.   Wt Readings from Last 3 Encounters:  04/30/19 204 lb 9.6 oz (92.8 kg)  07/17/18 187 lb (84.8 kg)  06/07/18 186 lb (84.4 kg)    ------------------------------------------------------------------------ He is also due for follow up lung nodule which was incidental finding on CT chest last year. He quit smoking about 2015. Denies any chest pains or dyspnea.  Allergies  Allergen Reactions  . Phenergan [Promethazine Hcl] Other (See Comments)    SEIZURES  . Mobic [Meloxicam] Other (See Comments)    Severe cramping     Current Outpatient Medications:  .  amLODipine-benazepril (LOTREL) 5-20 MG capsule, Take 1 capsule by mouth daily., Disp: 90 capsule, Rfl: 3 .  aspirin 81 MG EC tablet, Take 81 mg by mouth daily. Swallow whole., Disp: , Rfl:  .  atorvastatin (LIPITOR) 10 MG tablet, Take 1 tablet (10 mg total) by mouth daily., Disp: 90 tablet, Rfl: 3 .  Multiple Vitamin (MULTIVITAMIN) tablet, Take 1 tablet by mouth daily., Disp: , Rfl:  .  Omega-3 Fatty Acids (FISH OIL) 1000 MG CAPS, Take 1,000 mg by mouth daily. , Disp: , Rfl:  .  OVER THE COUNTER MEDICATION, Apply 1 application topically daily as needed (for pain). CBD Lotion, Disp: , Rfl:  .  tadalafil (CIALIS) 5 MG tablet, TAKE 1 TABLET BY MOUTH TWICE WEEKLY, Disp: 15 tablet, Rfl: 3  Review of Systems  Constitutional:  Negative.   Respiratory: Negative.   Cardiovascular: Negative.   Musculoskeletal: Negative.     Social History   Tobacco Use  . Smoking status: Former Smoker    Quit date: 06/21/2003    Years since quitting: 15.8  . Smokeless tobacco: Former Systems developer    Quit date: 06/21/2003  . Tobacco comment: 1-2 ppd for about 25 years  Substance Use Topics  . Alcohol use: Yes    Comment: 5-7 drinks each week; varies      Objective:   BP 124/79 (BP Location: Right Arm, Patient Position: Sitting, Cuff Size: Large)   Pulse 73    Temp (!) 97.1 F (36.2 C) (Temporal)   Wt 204 lb 9.6 oz (92.8 kg)   BMI 30.21 kg/m  Vitals:   04/30/19 0814  BP: 124/79  Pulse: 73  Temp: (!) 97.1 F (36.2 C)  TempSrc: Temporal  Weight: 204 lb 9.6 oz (92.8 kg)  Body mass index is 30.21 kg/m.   Physical Exam  General appearance: Obese male, cooperative and in no acute distress Head: Normocephalic, without obvious abnormality, atraumatic Respiratory: Respirations even and unlabored, normal respiratory rate Extremities: All extremities are intact.  Skin: Skin color, texture, turgor normal. No rashes seen  Psych: Appropriate mood and affect. Neurologic: Mental status: Alert, oriented to person, place, and time, thought content appropriate.   Results for orders placed or performed in visit on 04/30/19  POCT Urinalysis Dipstick  Result Value Ref Range   Color, UA yellow    Clarity, UA clear    Glucose, UA Negative Negative   Bilirubin, UA negative    Ketones, UA negative    Spec Grav, UA 1.010 1.010 - 1.025   Blood, UA negative    pH, UA 7.5 5.0 - 8.0   Protein, UA Negative Negative   Urobilinogen, UA 0.2 0.2 or 1.0 E.U./dL   Nitrite, UA negative    Leukocytes, UA Negative Negative   Appearance     Odor         Assessment & Plan    1. Abnormal findings on diagnostic imaging of lung  - CT Chest Wo Contrast; Future  2. Lung nodule < 6cm on CT  - CT Chest Wo Contrast; Future Has been 15 years since he quit smoking. If nodule is stable, may not need any additional follow up.   3. Epigastric abdominal pain Evaluated by Dr. Alice Reichert yesterday and anticipated GI endoscopy in January  - Comprehensive metabolic panel - Sed Rate (ESR) - H. pylori breath test - C-reactive protein  4. LLQ pain  - Comprehensive metabolic panel - Sed Rate (ESR) - C-reactive protein  5. Thyroid nodule  - TSH  6. Blood per rectum  - CBC - Sed Rate (ESR) - C-reactive protein  7. Essential hypertension Well controlled.   Continue current medications.    8. Hyperlipidemia, unspecified hyperlipidemia type He is tolerating atorvastatin well with no adverse effects.   - Lipid panel (fasting) - HgB A1c - Direct LDL  The entirety of the information documented in the History of Present Illness, Review of Systems and Physical Exam were personally obtained by me. Portions of this information were initially documented by Idelle Jo, CMA and reviewed by me for thoroughness and accuracy.     Lelon Huh, MD  [facility]

## 2019-04-30 NOTE — Patient Instructions (Signed)
.   Please review the attached list of medications and notify my office if there are any errors.   . Please bring all of your medications to every appointment so we can make sure that our medication list is the same as yours.   It is recommended to engage in 150 minutes of moderate exercise every week.

## 2019-05-01 LAB — COMPREHENSIVE METABOLIC PANEL
ALT: 47 IU/L — ABNORMAL HIGH (ref 0–44)
AST: 29 IU/L (ref 0–40)
Albumin/Globulin Ratio: 2 (ref 1.2–2.2)
Albumin: 4.8 g/dL (ref 3.8–4.9)
Alkaline Phosphatase: 59 IU/L (ref 39–117)
BUN/Creatinine Ratio: 17 (ref 9–20)
BUN: 14 mg/dL (ref 6–24)
Bilirubin Total: 0.9 mg/dL (ref 0.0–1.2)
CO2: 22 mmol/L (ref 20–29)
Calcium: 9.4 mg/dL (ref 8.7–10.2)
Chloride: 104 mmol/L (ref 96–106)
Creatinine, Ser: 0.84 mg/dL (ref 0.76–1.27)
GFR calc Af Amer: 113 mL/min/{1.73_m2} (ref >59)
GFR calc non Af Amer: 98 mL/min/{1.73_m2} (ref >59)
Globulin, Total: 2.4 g/dL (ref 1.5–4.5)
Glucose: 110 mg/dL — ABNORMAL HIGH (ref 65–99)
Potassium: 4.6 mmol/L (ref 3.5–5.2)
Sodium: 141 mmol/L (ref 134–144)
Total Protein: 7.2 g/dL (ref 6.0–8.5)

## 2019-05-01 LAB — CBC
Hematocrit: 42.7 % (ref 37.5–51.0)
Hemoglobin: 14.8 g/dL (ref 13.0–17.7)
MCH: 33.6 pg — ABNORMAL HIGH (ref 26.6–33.0)
MCHC: 34.7 g/dL (ref 31.5–35.7)
MCV: 97 fL (ref 79–97)
Platelets: 195 10*3/uL (ref 150–450)
RBC: 4.41 x10E6/uL (ref 4.14–5.80)
RDW: 12.2 % (ref 11.6–15.4)
WBC: 4.8 10*3/uL (ref 3.4–10.8)

## 2019-05-01 LAB — HEMOGLOBIN A1C
Est. average glucose Bld gHb Est-mCnc: 108 mg/dL
Hgb A1c MFr Bld: 5.4 % (ref 4.8–5.6)

## 2019-05-01 LAB — LIPID PANEL
Chol/HDL Ratio: 2.5 ratio (ref 0.0–5.0)
Cholesterol, Total: 165 mg/dL (ref 100–199)
HDL: 67 mg/dL (ref >39)
LDL Chol Calc (NIH): 71 mg/dL (ref 0–99)
Triglycerides: 163 mg/dL — ABNORMAL HIGH (ref 0–149)
VLDL Cholesterol Cal: 27 mg/dL (ref 5–40)

## 2019-05-01 LAB — LDL CHOLESTEROL, DIRECT: LDL Direct: 72 mg/dL (ref 0–99)

## 2019-05-01 LAB — C-REACTIVE PROTEIN: CRP: <1 mg/L (ref 0–10)

## 2019-05-01 LAB — TSH: TSH: 1.8 u[IU]/mL (ref 0.450–4.500)

## 2019-05-01 LAB — SEDIMENTATION RATE: Sed Rate: 3 mm/hr (ref 0–30)

## 2019-05-01 LAB — H. PYLORI BREATH TEST: H pylori Breath Test: NEGATIVE

## 2019-05-07 ENCOUNTER — Ambulatory Visit
Admission: RE | Admit: 2019-05-07 | Discharge: 2019-05-07 | Disposition: A | Discharge status: Home / Self Care | Payer: 59 | Source: Ambulatory Visit | Attending: Family Medicine | Admitting: Family Medicine

## 2019-05-07 ENCOUNTER — Other Ambulatory Visit: Payer: Self-pay

## 2019-05-07 DIAGNOSIS — R911 Solitary pulmonary nodule: Secondary | ICD-10-CM | POA: Diagnosis not present

## 2019-05-07 DIAGNOSIS — IMO0001 Reserved for inherently not codable concepts without codable children: Secondary | ICD-10-CM

## 2019-05-07 DIAGNOSIS — R918 Other nonspecific abnormal finding of lung field: Secondary | ICD-10-CM | POA: Diagnosis not present

## 2019-05-23 ENCOUNTER — Telehealth: Payer: Self-pay

## 2019-05-23 NOTE — Telephone Encounter (Signed)
Pt drop off letter that pt is requesting Dr. Caryn Section to write a letter on his behalf to [place]. Pt stated that this has been done in the past. Pt request a call when letter is ready for pick up. Letter from [employer] that pt dropped off is in Dr. Maralyn Sago box and pt was advised that Dr. Caryn Section is out of the office until Monday. Please advise. Thanks TNP

## 2019-05-24 NOTE — Telephone Encounter (Signed)
I have no idea what medical information he needs in this letter.

## 2019-05-27 NOTE — Telephone Encounter (Signed)
Tried calling patient no answer so left voicemail to return call.

## 2019-05-27 NOTE — Telephone Encounter (Signed)
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 Those have to be completed by a DOT certified medical examiner which I am not. The exam has to be done by a medical examiner listed on the [employer]. (please see the letter from the DOT that he dropped off)  If the Medical examiner needs additional medical information from me, he or she will need to contact me requesting that information.

## 2019-05-27 NOTE — Telephone Encounter (Signed)
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  He needs to have it renewed per that latter he brought in.  He states that he was just in and had all his labs done and thinks you could use the information from that visit to fill out a new DOT PE form.

## 2019-05-28 NOTE — Telephone Encounter (Signed)
Patient advised.

## 2019-05-31 DIAGNOSIS — E7800 Pure hypercholesterolemia, unspecified: Secondary | ICD-10-CM | POA: Diagnosis not present

## 2019-05-31 DIAGNOSIS — I10 Essential (primary) hypertension: Secondary | ICD-10-CM | POA: Diagnosis not present

## 2019-05-31 DIAGNOSIS — I2510 Atherosclerotic heart disease of native coronary artery without angina pectoris: Secondary | ICD-10-CM | POA: Diagnosis not present

## 2019-05-31 DIAGNOSIS — R079 Chest pain, unspecified: Secondary | ICD-10-CM | POA: Diagnosis not present

## 2019-06-20 ENCOUNTER — Other Ambulatory Visit: Payer: Self-pay

## 2019-06-20 ENCOUNTER — Other Ambulatory Visit
Admission: RE | Admit: 2019-06-20 | Discharge: 2019-06-20 | Disposition: A | Discharge status: Home / Self Care | Payer: 59 | Source: Ambulatory Visit | Attending: Internal Medicine | Admitting: Internal Medicine

## 2019-06-20 DIAGNOSIS — Z20828 Contact with and (suspected) exposure to other viral communicable diseases: Secondary | ICD-10-CM | POA: Insufficient documentation

## 2019-06-20 DIAGNOSIS — Z01812 Encounter for preprocedural laboratory examination: Secondary | ICD-10-CM | POA: Diagnosis not present

## 2019-06-21 LAB — SARS CORONAVIRUS 2 (TAT 6-24 HRS): SARS Coronavirus 2: NEGATIVE

## 2019-06-22 IMAGING — US US CAROTID DUPLEX BILAT
2 series · 13 of 24 positions shown · non-contrast
Comparison: None.

CLINICAL DATA: Left neck bruit

EXAM:
BILATERAL CAROTID DUPLEX ULTRASOUND
TECHNIQUE: Gray scale imaging, color Doppler and duplex ultrasound were
performed of bilateral carotid and vertebral arteries in the neck.

[Series 1: us carotid duplex bilat · 11 of 91 slices shown (1 of 2)]
[im 1/91]
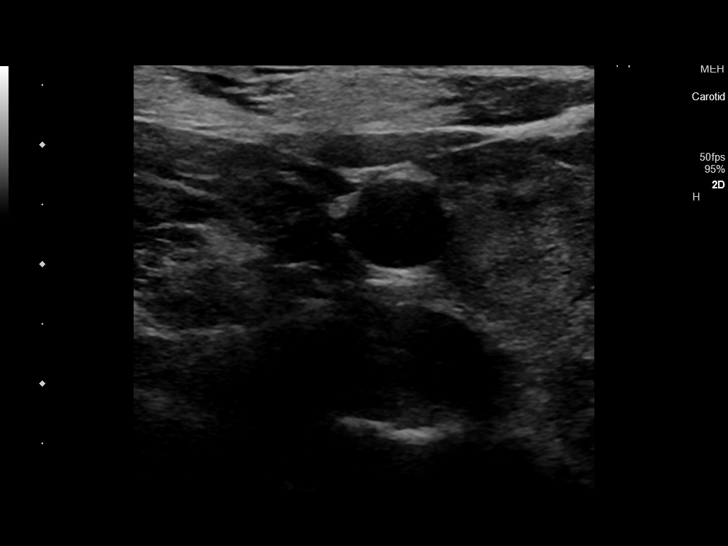
[im 9/91]
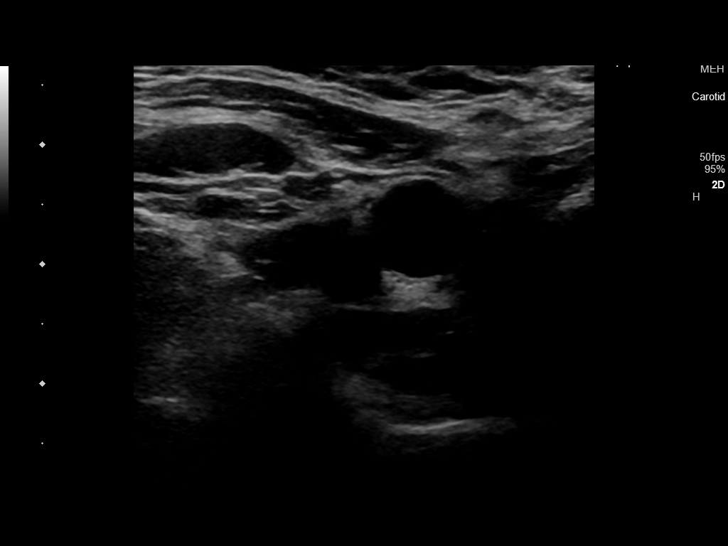
[im 22/91]
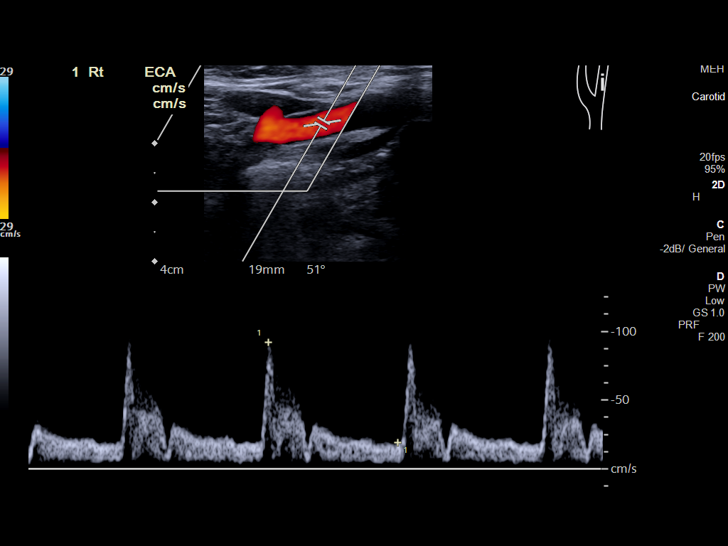
[im 31/91]
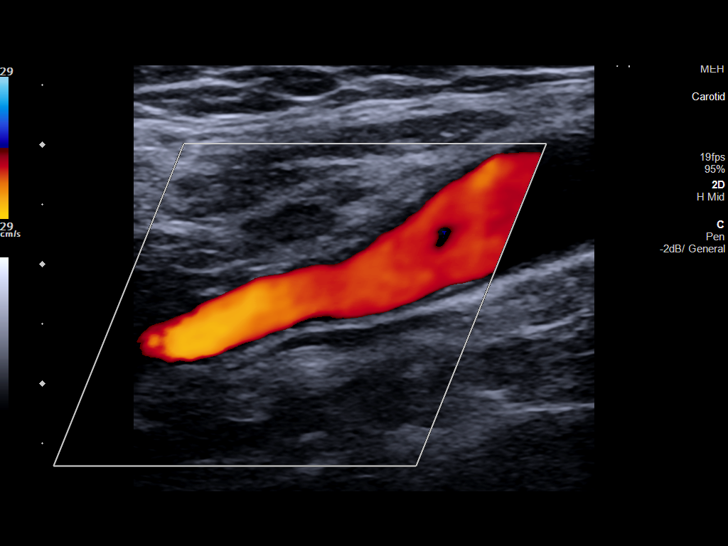
[im 39/91]
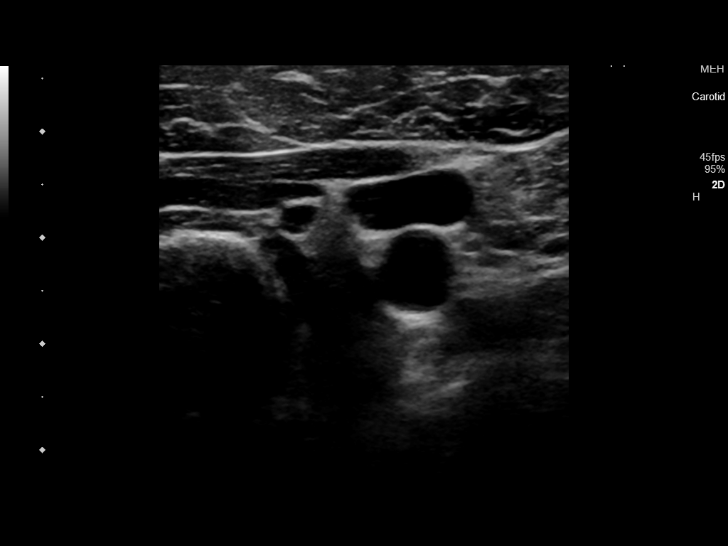
[im 48/91]
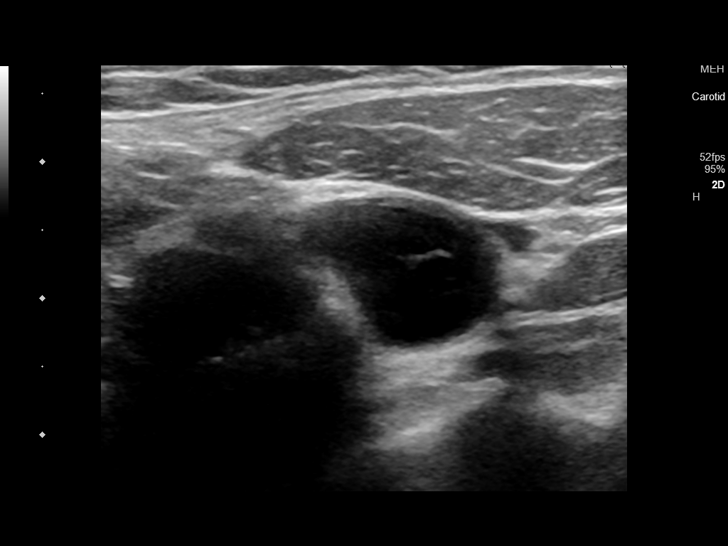
[im 56/91]
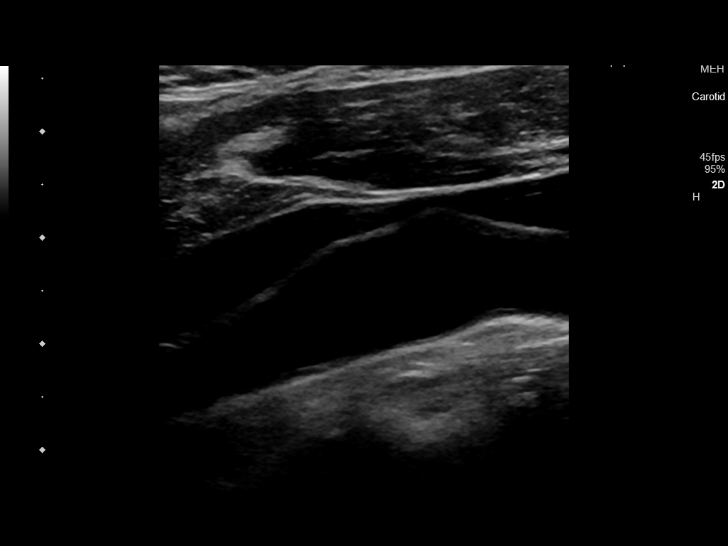
[im 61/91]
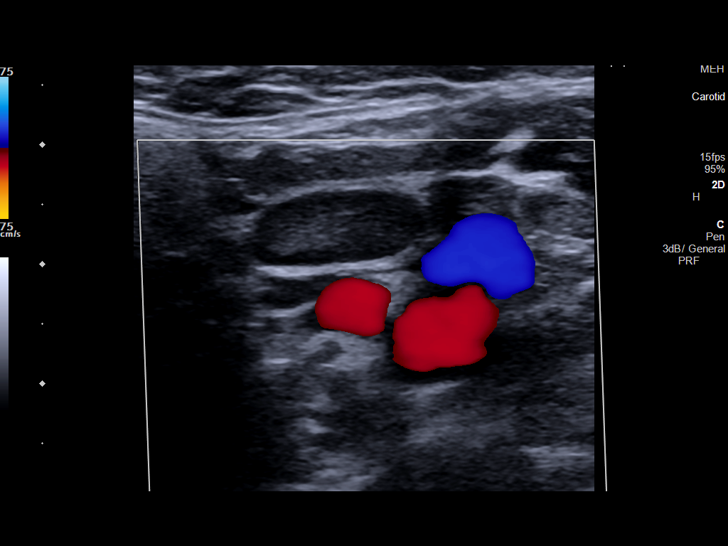
[im 69/91]
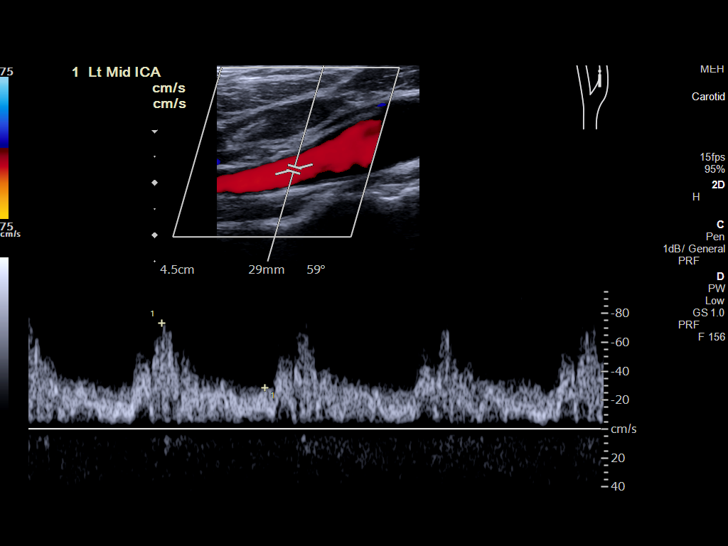
[im 78/91]
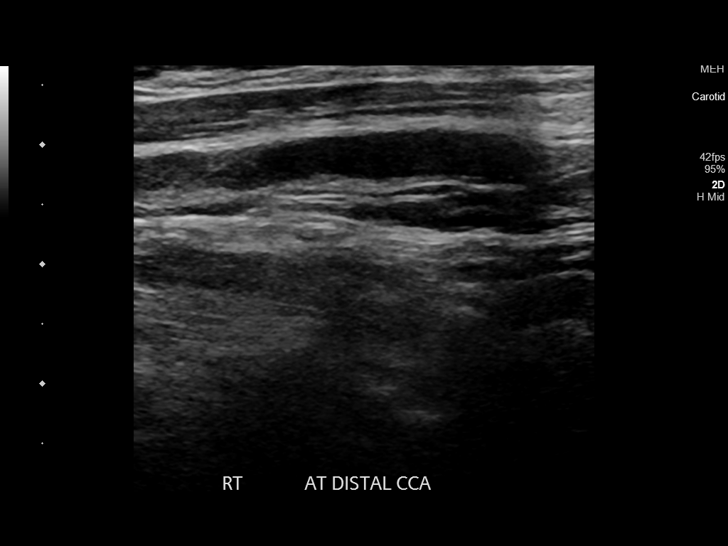
[im 86/91]
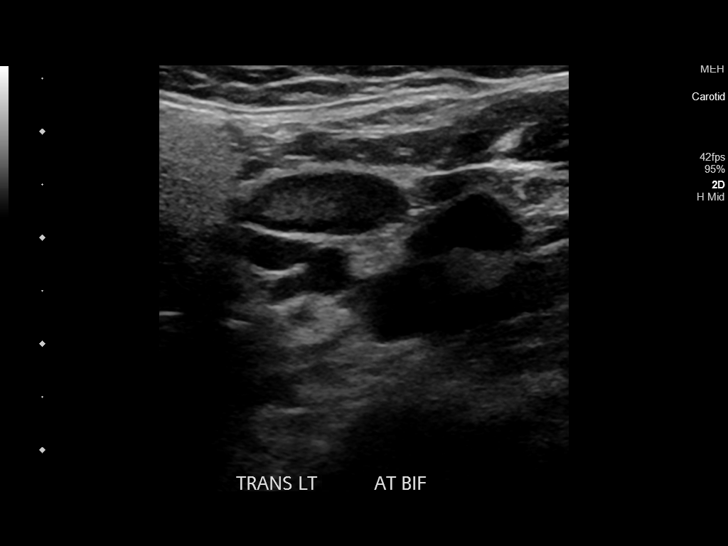

[Series 1001: us carotid duplex bilat · 9 acquisitions, 2 frames shown (2 of 2)]
[im 1/9]
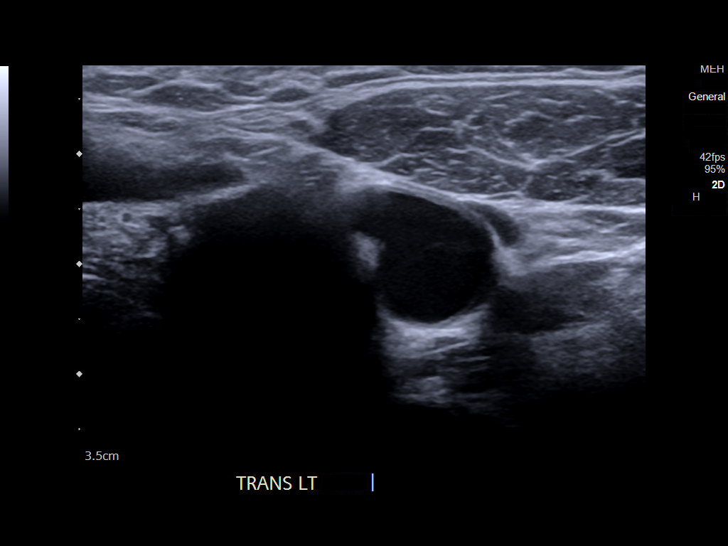
[im 9/9]
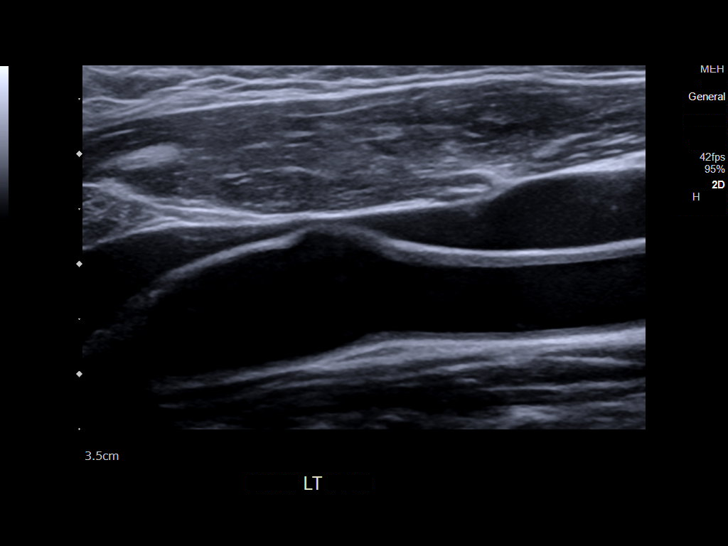

[13 of 24 positions shown; findings below may reference images not displayed]

FINDINGS: Criteria: Quantification of carotid stenosis is based on velocity
parameters that correlate the residual internal carotid diameter
with NASCET-based stenosis levels, using the diameter of the distal
internal carotid lumen as the denominator for stenosis measurement.

The following velocity measurements were obtained:

RIGHT

ICA:  72 cm/sec

CCA:  93 cm/sec

SYSTOLIC ICA/CCA RATIO:

DIASTOLIC ICA/CCA RATIO:

ECA:  92 cm/sec

LEFT

ICA:  87 cm/sec

CCA:  106 cm/sec

SYSTOLIC ICA/CCA RATIO:

DIASTOLIC ICA/CCA RATIO:

ECA:  87 cm/sec

RIGHT CAROTID ARTERY: Little if any plaque in the bulb. Low
resistance internal carotid Doppler pattern.

RIGHT VERTEBRAL ARTERY:  Antegrade.

LEFT CAROTID ARTERY: There is a linear hyperechoic structure within
the bulb extending from the wall. Color Doppler imaging demonstrates
antegrade flow throughout the bulb without resistance of flow. Low
resistance internal carotid Doppler pattern is preserved.

LEFT VERTEBRAL ARTERY:  Antegrade.

Subcentimeter short axis diameter lymph nodes with a fatty hilum are
noted.
IMPRESSION: Less than 50% stenosis in the right and left internal carotid
arteries.

Linear hyperechoic structure within the left bulb is noted. This may
simply represent chronic atherosclerotic plaque. A non flow limiting
dissection cannot be excluded. Consider CT angiogram of the neck to
further characterize.

## 2019-06-25 ENCOUNTER — Encounter: Payer: Self-pay | Admitting: Internal Medicine

## 2019-06-26 ENCOUNTER — Encounter
Admission: RE | Disposition: A | Discharge status: Home / Self Care | Payer: Self-pay | Source: Home / Self Care | Attending: Internal Medicine

## 2019-06-26 ENCOUNTER — Ambulatory Visit: Payer: 59 | Admitting: Anesthesiology

## 2019-06-26 ENCOUNTER — Ambulatory Visit
Admission: RE | Admit: 2019-06-26 | Discharge: 2019-06-26 | Disposition: A | Discharge status: Home / Self Care | Payer: 59 | Attending: Internal Medicine | Admitting: Internal Medicine

## 2019-06-26 ENCOUNTER — Encounter: Payer: Self-pay | Admitting: Internal Medicine

## 2019-06-26 DIAGNOSIS — Z87891 Personal history of nicotine dependence: Secondary | ICD-10-CM | POA: Diagnosis not present

## 2019-06-26 DIAGNOSIS — Z8711 Personal history of peptic ulcer disease: Secondary | ICD-10-CM | POA: Insufficient documentation

## 2019-06-26 DIAGNOSIS — Z79899 Other long term (current) drug therapy: Secondary | ICD-10-CM | POA: Insufficient documentation

## 2019-06-26 DIAGNOSIS — R1011 Right upper quadrant pain: Secondary | ICD-10-CM | POA: Diagnosis not present

## 2019-06-26 DIAGNOSIS — K649 Unspecified hemorrhoids: Secondary | ICD-10-CM | POA: Diagnosis not present

## 2019-06-26 DIAGNOSIS — K642 Third degree hemorrhoids: Secondary | ICD-10-CM | POA: Insufficient documentation

## 2019-06-26 DIAGNOSIS — Z86010 Personal history of colonic polyps: Secondary | ICD-10-CM | POA: Insufficient documentation

## 2019-06-26 DIAGNOSIS — K2970 Gastritis, unspecified, without bleeding: Secondary | ICD-10-CM | POA: Diagnosis not present

## 2019-06-26 DIAGNOSIS — K921 Melena: Secondary | ICD-10-CM | POA: Diagnosis not present

## 2019-06-26 DIAGNOSIS — I10 Essential (primary) hypertension: Secondary | ICD-10-CM | POA: Diagnosis not present

## 2019-06-26 DIAGNOSIS — I2510 Atherosclerotic heart disease of native coronary artery without angina pectoris: Secondary | ICD-10-CM | POA: Diagnosis not present

## 2019-06-26 DIAGNOSIS — Z1211 Encounter for screening for malignant neoplasm of colon: Secondary | ICD-10-CM | POA: Diagnosis not present

## 2019-06-26 DIAGNOSIS — Z7982 Long term (current) use of aspirin: Secondary | ICD-10-CM | POA: Diagnosis not present

## 2019-06-26 DIAGNOSIS — R140 Abdominal distension (gaseous): Secondary | ICD-10-CM | POA: Diagnosis not present

## 2019-06-26 DIAGNOSIS — E7800 Pure hypercholesterolemia, unspecified: Secondary | ICD-10-CM | POA: Diagnosis not present

## 2019-06-26 DIAGNOSIS — K625 Hemorrhage of anus and rectum: Secondary | ICD-10-CM | POA: Diagnosis not present

## 2019-06-26 DIAGNOSIS — R6881 Early satiety: Secondary | ICD-10-CM | POA: Diagnosis not present

## 2019-06-26 DIAGNOSIS — R1013 Epigastric pain: Secondary | ICD-10-CM | POA: Insufficient documentation

## 2019-06-26 DIAGNOSIS — K2960 Other gastritis without bleeding: Secondary | ICD-10-CM | POA: Diagnosis not present

## 2019-06-26 HISTORY — PX: COLONOSCOPY WITH PROPOFOL: SHX5780

## 2019-06-26 HISTORY — DX: Pure hypercholesterolemia, unspecified: E78.00

## 2019-06-26 HISTORY — DX: Heart disease, unspecified: I51.9

## 2019-06-26 HISTORY — PX: ESOPHAGOGASTRODUODENOSCOPY (EGD) WITH PROPOFOL: SHX5813

## 2019-06-26 HISTORY — DX: Polyp of colon: K63.5

## 2019-06-26 HISTORY — DX: Duodenal ulcer, unspecified as acute or chronic, without hemorrhage or perforation: K26.9

## 2019-06-26 SURGERY — COLONOSCOPY WITH PROPOFOL
Anesthesia: General

## 2019-06-26 MED ORDER — SODIUM CHLORIDE 0.9 % IV SOLN
INTRAVENOUS | Status: DC
  Administered 2019-06-26: 1000 mL via INTRAVENOUS

## 2019-06-26 MED ORDER — PROPOFOL 10 MG/ML IV BOLUS
INTRAVENOUS | Status: DC | PRN
  Administered 2019-06-26 (×2): 50 mg via INTRAVENOUS

## 2019-06-26 MED ORDER — PROPOFOL 500 MG/50ML IV EMUL
INTRAVENOUS | Status: AC
  Filled 2019-06-26: qty 50

## 2019-06-26 MED ORDER — PROPOFOL 500 MG/50ML IV EMUL
INTRAVENOUS | Status: DC | PRN
  Administered 2019-06-26: 175 ug/kg/min via INTRAVENOUS

## 2019-06-26 NOTE — Op Note (Signed)
[facility] Patient Name: Kevin Crawford Procedure Date: 06/26/2019 10:24 AM MRN: [id number] Account #: [id number] Date of Birth: 1961/01/25 Admit Type: Outpatient Age: 59 Room: [facility] ENDO ROOM 3 Gender: Male Note Status: Finalized Procedure:             Colonoscopy Indications:           Hematochezia Providers:             Benay Pike. Koltan Portocarrero MD, MD Medicines:             Propofol per Anesthesia Complications:         No immediate complications. Procedure:             Pre-Anesthesia Assessment:                        - The risks and benefits of the procedure and the                         sedation options and risks were discussed with the                         patient. All questions were answered and informed                         consent was obtained.                        - Patient identification and proposed procedure were                         verified prior to the procedure by the nurse. The                         procedure was verified in the procedure room.                        - ASA Grade Assessment: III - A patient with severe                         systemic disease.                        - After reviewing the risks and benefits, the patient                         was deemed in satisfactory condition to undergo the                         procedure.                        After obtaining informed consent, the colonoscope was                         passed under direct vision. Throughout the procedure,                         the patient's blood pressure, pulse, and oxygen  saturations were monitored continuously. The                         Colonoscope was introduced through the anus and                         advanced to the the cecum, identified by appendiceal                         orifice and ileocecal valve. The colonoscopy was                         performed without difficulty. The patient  tolerated                         the procedure well. The quality of the bowel                         preparation was excellent. The ileocecal valve,                         appendiceal orifice, and rectum were photographed. Findings:      The perianal and digital rectal examinations were normal. Pertinent       negatives include normal sphincter tone and no palpable rectal lesions.      The colon (entire examined portion) appeared normal.      Non-bleeding internal hemorrhoids were found during retroflexion. The       hemorrhoids were Grade III (internal hemorrhoids that prolapse but       require manual reduction).      The exam was otherwise without abnormality. Impression:            - The entire examined colon is normal.                        - Non-bleeding internal hemorrhoids.                        - The examination was otherwise normal.                        - No specimens collected. Recommendation:        - Patient has a contact number available for                         emergencies. The signs and symptoms of potential                         delayed complications were discussed with the patient.                         Return to normal activities tomorrow. Written                         discharge instructions were provided to the patient.                        - High fiber diet.                        -  Continue present medications.                        - Repeat colonoscopy in 5 years for surveillance.                        - Patient has documented history of tubular                         adenomatous polyps of the colon.                        - Return to GI office PRN.                        - The findings and recommendations were discussed with                         the patient. Procedure Code(s):     --- Professional ---                        [phone], Colonoscopy, flexible; diagnostic, including                         collection of specimen(s) by brushing or  washing, when                         performed (separate procedure) Diagnosis Code(s):     --- Professional ---                        K92.1, Melena (includes Hematochezia)                        K64.2, Third degree hemorrhoids CPT copyright 2019 American Medical Association. All rights reserved. The codes documented in this report are preliminary and upon coder review may  be revised to meet current compliance requirements. Efrain Sella MD, MD 06/26/2019 11:06:23 AM This report has been signed electronically. Number of Addenda: 0 Note Initiated On: 06/26/2019 10:24 AM Scope Withdrawal Time: 0 hours 6 minutes 6 seconds  Total Procedure Duration: 0 hours 9 minutes 9 seconds  Estimated Blood Loss:  Estimated blood loss: none.      [facility]

## 2019-06-26 NOTE — Anesthesia Postprocedure Evaluation (Signed)
Anesthesia Post Note  Patient: Kevin Crawford  Procedure(s) Performed: COLONOSCOPY WITH PROPOFOL (N/A ) ESOPHAGOGASTRODUODENOSCOPY (EGD) WITH PROPOFOL (N/A )  Patient location during evaluation: PACU Anesthesia Type: General Level of consciousness: awake and alert Pain management: pain level controlled Vital Signs Assessment: post-procedure vital signs reviewed and stable Respiratory status: spontaneous breathing, nonlabored ventilation, respiratory function stable and patient connected to nasal cannula oxygen Cardiovascular status: blood pressure returned to baseline and stable Postop Assessment: no apparent nausea or vomiting Anesthetic complications: no     Last Vitals:  Vitals:   06/26/19 1115 06/26/19 1125  BP: 114/79 116/74  Pulse: 81 89  Resp: 16 (!) 21  Temp:    SpO2: 99% 96%    Last Pain:  Vitals:   06/26/19 1125  TempSrc:   PainSc: 0-No pain                 Arita Miss

## 2019-06-26 NOTE — Op Note (Signed)
[facility] Patient Name: Kevin Crawford Procedure Date: 06/26/2019 10:24 AM MRN: [id number] Account #: [id number] Date of Birth: Dec 07, 1960 Admit Type: Outpatient Age: 59 Room: [facility] ENDO ROOM 3 Gender: Male Note Status: Finalized Procedure:             Upper GI endoscopy Indications:           Epigastric abdominal pain, Abdominal pain in the right                         upper quadrant, Abdominal bloating Providers:             Benay Pike. Alice Reichert MD, MD Referring MD:          Kirstie Peri. Caryn Section, MD (Referring MD) Medicines:             Propofol per Anesthesia Complications:         No immediate complications. Procedure:             Pre-Anesthesia Assessment:                        - The risks and benefits of the procedure and the                         sedation options and risks were discussed with the                         patient. All questions were answered and informed                         consent was obtained.                        - Patient identification and proposed procedure were                         verified prior to the procedure by the nurse. The                         procedure was verified in the procedure room.                        - ASA Grade Assessment: III - A patient with severe                         systemic disease.                        - After reviewing the risks and benefits, the patient                         was deemed in satisfactory condition to undergo the                         procedure.                        After obtaining informed consent, the endoscope was                         passed under  direct vision. Throughout the procedure,                         the patient's blood pressure, pulse, and oxygen                         saturations were monitored continuously. The Endoscope                         was introduced through the mouth, and advanced to the                         third part of  duodenum. The upper GI endoscopy was                         accomplished without difficulty. The patient tolerated                         the procedure well. Findings:      The esophagus was normal.      Localized mild inflammation characterized by erosions and erythema was       found in the gastric antrum. Biopsies were taken with a cold forceps for       Helicobacter pylori testing.      The examined duodenum was normal.      The exam was otherwise without abnormality. Impression:            - Normal esophagus.                        - Gastritis. Biopsied.                        - Normal examined duodenum.                        - The examination was otherwise normal. Recommendation:        - Await pathology results.                        - Proceed with colonoscopy Procedure Code(s):     --- Professional ---                        [phone], Esophagogastroduodenoscopy, flexible,                         transoral; with biopsy, single or multiple Diagnosis Code(s):     --- Professional ---                        R14.0, Abdominal distension (gaseous)                        R10.11, Right upper quadrant pain                        K29.70, Gastritis, unspecified, without bleeding                        R10.13, Epigastric pain CPT copyright 2019 American Medical Association. All rights reserved. The codes documented in this report are preliminary and  upon coder review may  be revised to meet current compliance requirements. Efrain Sella MD, MD 06/26/2019 10:50:32 AM This report has been signed electronically. Number of Addenda: 0 Note Initiated On: 06/26/2019 10:24 AM Estimated Blood Loss:  Estimated blood loss: none.      [facility]

## 2019-06-26 NOTE — Anesthesia Preprocedure Evaluation (Signed)
Anesthesia Evaluation  Patient identified by MRN, date of birth, ID band Patient awake    Reviewed: Allergy & Precautions, NPO status , Patient's Chart, lab work & pertinent test results  History of Anesthesia Complications Negative for: history of anesthetic complications  Airway Mallampati: II  TM Distance: >3 FB Neck ROM: Full    Dental no notable dental hx. (+) Teeth Intact   Pulmonary neg pulmonary ROS, neg COPD, Patient abstained from smoking.Not current smoker, former smoker,    Pulmonary exam normal breath sounds clear to auscultation       Cardiovascular Exercise Tolerance: Good METShypertension, Pt. on medications + CAD  (-) Past MI and (-) Cardiac Stents  Rhythm:Regular Rate:Normal - Systolic murmurs Non obstructive CAD, 50% LAD blockage. Cardiology said no stent needed per patient    Neuro/Psych negative neurological ROS  negative psych ROS   GI/Hepatic PUD, GERD  ,(+)     substance abuse  alcohol use,   Endo/Other  neg diabetes  Renal/GU negative Renal ROS     Musculoskeletal   Abdominal   Peds  Hematology   Anesthesia Other Findings   Reproductive/Obstetrics                             Anesthesia Physical Anesthesia Plan  ASA: II  Anesthesia Plan: General   Post-op Pain Management:    Induction: Intravenous  PONV Risk Score and Plan: 2 and Propofol infusion and TIVA  Airway Management Planned: Nasal Cannula  Additional Equipment: None  Intra-op Plan:   Post-operative Plan:   Informed Consent: I have reviewed the patients History and Physical, chart, labs and discussed the procedure including the risks, benefits and alternatives for the proposed anesthesia with the patient or authorized representative who has indicated his/her understanding and acceptance.     Dental advisory given  Plan Discussed with: CRNA and Surgeon  Anesthesia Plan Comments:  (Discussed risks of anesthesia with patient, including possibility of difficulty with spontaneous ventilation under anesthesia necessitating airway intervention, PONV, and rare risks such as cardiac or respiratory events. Patient understands.)        Anesthesia Quick Evaluation

## 2019-06-26 NOTE — H&P (Signed)
Outpatient short stay form Pre-procedure 06/26/2019 10:34 AM Tressie Ragin K. Alice Reichert, M.D.  Primary Physician: Lelon Huh, M.D.  Reason for visit:  Early satiety, bloating, hx PUD, rectal bleeding  History of present illness:  59 y/o male with personal hx of adenomatous cecal polyp in 2017 as well as hemorrhoids presents for recurrent rectal bleeding after bowel movements for the past several weeks.  He reports his bleeding has gotten worse.  There is no melena.  There appears to be some epigastric and right upper quadrant pain with bloating sensation.  There is lower abdominal cramping just prior to the bowel movement.  There is no weight loss.  There is a negative family history of colon cancer or colon polyps.  Patient has no dysphagia.    Current Facility-Administered Medications:  .  0.9 %  sodium chloride infusion, , Intravenous, Continuous, [place], Benay Pike, MD, Last Rate: 20 mL/hr at 06/26/19 0941, 1,000 mL at 06/26/19 0941  Medications Prior to Admission  Medication Sig Dispense Refill Last Dose  . amLODipine-benazepril (LOTREL) 5-20 MG capsule Take 1 capsule by mouth daily. 90 capsule 3 06/26/2019 at 0530  . aspirin 81 MG EC tablet Take 81 mg by mouth daily. Swallow whole.   Past Week at Unknown time  . atorvastatin (LIPITOR) 10 MG tablet Take 1 tablet (10 mg total) by mouth daily. 90 tablet 3 06/26/2019 at 0530  . Multiple Vitamin (MULTIVITAMIN) tablet Take 1 tablet by mouth daily.   Past Week at Unknown time  . Omega-3 Fatty Acids (FISH OIL) 1000 MG CAPS Take 1,000 mg by mouth daily.    Past Week at Unknown time  . OVER THE COUNTER MEDICATION Apply 1 application topically daily as needed (for pain). CBD Lotion   Past Week at Unknown time  . tadalafil (CIALIS) 5 MG tablet TAKE 1 TABLET BY MOUTH TWICE WEEKLY 15 tablet 3      Allergies  Allergen Reactions  . Phenergan [Promethazine Hcl] Other (See Comments)    SEIZURES  . Mobic [Meloxicam] Other (See Comments)    Severe cramping      Past Medical History:  Diagnosis Date  . Colon polyp   . Duodenal ulcer   . H/O colonoscopy 2019  . Heart disease   . Heart murmur   . High cholesterol   . Hypertension     Review of systems:  Otherwise negative.    Physical Exam  Gen: Alert, oriented. Appears stated age.  HEENT: [place]/AT. PERRLA. Lungs: CTA, no wheezes. CV: RR nl S1, S2. Abd: soft, benign, no masses. BS+ Ext: No edema. Pulses 2+    Planned procedures: Proceed with EGD and colonoscopy. The patient understands the nature of the planned procedure, indications, risks, alternatives and potential complications including but not limited to bleeding, infection, perforation, damage to internal organs and possible oversedation/side effects from anesthesia. The patient agrees and gives consent to proceed.  Please refer to procedure notes for findings, recommendations and patient disposition/instructions.     Lya Holben K. Alice Reichert, M.D. Gastroenterology 06/26/2019  10:34 AM

## 2019-06-26 NOTE — Transfer of Care (Signed)
Immediate Anesthesia Transfer of Care Note  Patient: Kevin Crawford  Procedure(s) Performed: COLONOSCOPY WITH PROPOFOL (N/A ) ESOPHAGOGASTRODUODENOSCOPY (EGD) WITH PROPOFOL (N/A )  Patient Location: PACU  Anesthesia Type:General  Level of Consciousness: awake and alert   Airway & Oxygen Therapy: Patient Spontanous Breathing and Patient connected to nasal cannula oxygen  Post-op Assessment: Report given to RN and Post -op Vital signs reviewed and stable  Post vital signs: Reviewed and stable  Last Vitals:  Vitals Value Taken Time  BP 107/69 06/26/19 1106  Temp    Pulse 83 06/26/19 1106  Resp 17 06/26/19 1106  SpO2 99 % 06/26/19 1106  Vitals shown include unvalidated device data.  Last Pain:  Vitals:   06/26/19 1105  TempSrc:   PainSc: 0-No pain         Complications: No apparent anesthesia complications

## 2019-06-26 NOTE — Interval H&P Note (Signed)
History and Physical Interval Note:  06/26/2019 10:38 AM  Kevin Crawford  has presented today for surgery, with the diagnosis of ABD BLOATING, EARLY SATIETY, HX PEPTIC ULCER RECTAL BLD, P HX COLON POLYP.  The various methods of treatment have been discussed with the patient and family. After consideration of risks, benefits and other options for treatment, the patient has consented to  Procedure(s): COLONOSCOPY WITH PROPOFOL (N/A) ESOPHAGOGASTRODUODENOSCOPY (EGD) WITH PROPOFOL (N/A) as a surgical intervention.  The patient's history has been reviewed, patient examined, no change in status, stable for surgery.  I have reviewed the patient's chart and labs.  Questions were answered to the patient's satisfaction.     [place], [place]

## 2019-06-27 LAB — SURGICAL PATHOLOGY

## 2019-07-03 ENCOUNTER — Telehealth: Payer: Self-pay | Admitting: Family Medicine

## 2019-07-03 DIAGNOSIS — N529 Male erectile dysfunction, unspecified: Secondary | ICD-10-CM

## 2019-07-03 MED ORDER — TADALAFIL 5 MG PO TABS: ORAL_TABLET | ORAL | 3 refills | Status: DC

## 2019-07-03 NOTE — Addendum Note (Signed)
Addended by: Jefferson Fuel on: 07/03/2019 03:21 PM   Modules accepted: Orders

## 2019-07-03 NOTE — Telephone Encounter (Signed)
tadalafil (CIALIS) 5 MG tablet    Patient requesting refills. 30 tablets.      Pharmacy:  [place], [address] Phone:  [phone]  Fax:  [phone]

## 2019-07-10 ENCOUNTER — Telehealth: Payer: Self-pay | Admitting: Family Medicine

## 2019-07-10 DIAGNOSIS — E785 Hyperlipidemia, unspecified: Secondary | ICD-10-CM

## 2019-07-10 DIAGNOSIS — I10 Essential (primary) hypertension: Secondary | ICD-10-CM

## 2019-07-10 MED ORDER — ATORVASTATIN CALCIUM 10 MG PO TABS: 10.0000 mg | ORAL_TABLET | Freq: Every day | ORAL | 3 refills | Status: DC

## 2019-07-10 MED ORDER — TADALAFIL 5 MG PO TABS: ORAL_TABLET | ORAL | 8 refills | Status: DC

## 2019-07-10 MED ORDER — AMLODIPINE BESY-BENAZEPRIL HCL 5-20 MG PO CAPS: 1.0000 | ORAL_CAPSULE | Freq: Every day | ORAL | 3 refills | Status: DC

## 2019-07-10 NOTE — Telephone Encounter (Signed)
Patient is requesting he needs medication sent to [facility] on [address]   Patient is still requesting 30 tablets, and would like 8 refills. Call back [phone]

## 2019-07-10 NOTE — Telephone Encounter (Signed)
done

## 2019-07-10 NOTE — Telephone Encounter (Signed)
RX REFILL  amLODipine-benazepril (LOTREL) 5-20 MG capsule  atorvastatin (LIPITOR) 10 MG tablet   [place], [address] Phone:  [phone]  Fax:  [phone]

## 2019-07-10 NOTE — Telephone Encounter (Signed)
Please advise if ok to change prescription qty and refills as patient request below.

## 2019-07-10 NOTE — Addendum Note (Signed)
Addended by: Lelon Huh E on: 07/10/2019 01:09 PM   Modules accepted: Orders

## 2019-07-17 DIAGNOSIS — L821 Other seborrheic keratosis: Secondary | ICD-10-CM | POA: Diagnosis not present

## 2019-07-17 DIAGNOSIS — L578 Other skin changes due to chronic exposure to nonionizing radiation: Secondary | ICD-10-CM | POA: Diagnosis not present

## 2019-07-17 DIAGNOSIS — L814 Other melanin hyperpigmentation: Secondary | ICD-10-CM | POA: Diagnosis not present

## 2019-07-17 DIAGNOSIS — L219 Seborrheic dermatitis, unspecified: Secondary | ICD-10-CM | POA: Diagnosis not present

## 2019-07-17 DIAGNOSIS — Z1283 Encounter for screening for malignant neoplasm of skin: Secondary | ICD-10-CM | POA: Diagnosis not present

## 2019-07-17 DIAGNOSIS — L570 Actinic keratosis: Secondary | ICD-10-CM | POA: Diagnosis not present

## 2019-07-17 DIAGNOSIS — D2261 Melanocytic nevi of right upper limb, including shoulder: Secondary | ICD-10-CM | POA: Diagnosis not present

## 2019-07-17 DIAGNOSIS — R58 Hemorrhage, not elsewhere classified: Secondary | ICD-10-CM | POA: Diagnosis not present

## 2019-07-22 DIAGNOSIS — L219 Seborrheic dermatitis, unspecified: Secondary | ICD-10-CM | POA: Diagnosis not present

## 2019-08-26 ENCOUNTER — Ambulatory Visit (INDEPENDENT_AMBULATORY_CARE_PROVIDER_SITE_OTHER): Payer: 59 | Admitting: Family Medicine

## 2019-08-26 ENCOUNTER — Other Ambulatory Visit: Payer: Self-pay

## 2019-08-26 ENCOUNTER — Encounter: Payer: Self-pay | Admitting: Family Medicine

## 2019-08-26 VITALS — BP 118/78 | HR 87 | Temp 97.3°F | Wt 202.6 lb

## 2019-08-26 DIAGNOSIS — M79651 Pain in right thigh: Secondary | ICD-10-CM | POA: Diagnosis not present

## 2019-08-26 NOTE — Progress Notes (Signed)
Patient: Kevin Crawford Male    DOB: 02-10-1961   59 y.o.   MRN: [id number] Visit Date: 08/26/2019  Today's Provider: Lelon Huh, MD   Chief Complaint  Patient presents with  . Groin Pain   Subjective:    Patient C/O upper right thigh pulsating. Patient states the pulsating started yesterday. This is constant. He states that pulsating sensation goes along the medial aspect of the right thigh. It is very regular and persistent, but slower than his heart rate. This is not tender. No known recent injuries. Patient has a history of coronary blockages and is very concerned about possibly having vascular disease or blood clots in his thigh.  The only thing that relieves symptoms is raising leg 90 degrees. He states it is not really painful. Not had any swelling. No claudication.  Allergies  Allergen Reactions  . Phenergan [Promethazine Hcl] Other (See Comments)    SEIZURES  . Mobic [Meloxicam] Other (See Comments)    Severe cramping     Current Outpatient Medications:  .  amLODipine-benazepril (LOTREL) 5-20 MG capsule, Take 1 capsule by mouth daily., Disp: 90 capsule, Rfl: 3 .  aspirin 81 MG EC tablet, Take 81 mg by mouth daily. Swallow whole., Disp: , Rfl:  .  atorvastatin (LIPITOR) 10 MG tablet, Take 1 tablet (10 mg total) by mouth daily., Disp: 90 tablet, Rfl: 3 .  Multiple Vitamin (MULTIVITAMIN) tablet, Take 1 tablet by mouth daily., Disp: , Rfl:  .  Omega-3 Fatty Acids (FISH OIL) 1000 MG CAPS, Take 1,000 mg by mouth daily. , Disp: , Rfl:  .  OVER THE COUNTER MEDICATION, Apply 1 application topically daily as needed (for pain). CBD Lotion, Disp: , Rfl:  .  tadalafil (CIALIS) 5 MG tablet, TAKE 1 TABLET BY MOUTH TWICE WEEKLY, Disp: 30 tablet, Rfl: 8  Review of Systems  Constitutional: Negative.   Respiratory: Negative.   Cardiovascular: Negative.   Musculoskeletal: Negative.     Social History   Tobacco Use  . Smoking status: Former Smoker    Quit date: 06/21/2003      Years since quitting: 16.1  . Smokeless tobacco: Former Systems developer    Types: Snuff, Sarina Ser    Quit date: 06/21/2003  . Tobacco comment: 1-2 ppd for about 25 years  Substance Use Topics  . Alcohol use: Yes    Comment: 5-7 drinks each week; varies      Objective:   BP 118/78 (BP Location: Right Arm, Patient Position: Sitting, Cuff Size: Normal)   Pulse 87   Temp (!) 97.3 F (36.3 C) (Temporal)   Wt 202 lb 9.6 oz (91.9 kg)   BMI 29.92 kg/m  Vitals:   08/26/19 0937  BP: 118/78  Pulse: 87  Temp: (!) 97.3 F (36.3 C)  TempSrc: Temporal  Weight: 202 lb 9.6 oz (91.9 kg)  Body mass index is 29.92 kg/m.   Physical Exam  General appearance:  Overweight male, cooperative and in no acute distress Head: Normocephalic, without obvious abnormality, atraumatic Respiratory: Respirations even and unlabored, normal respiratory rate Extremities: +2 pedal pulses. No thigh masses or tenderness.     Assessment & Plan    1. Pain in right thigh Reassured patient that symptoms are not typical for thrombosis or dissection which he was concerned about. Also discussed possibility of femoral artery aneurysm which would be pretty unlikely but could not rule out without vascular evaluation. Discussed that symptoms are more suggestive of muscle fasciculations in which case  it should resolve after several days of relative rest.  He is anxious about vascular disease and prefers to initiate vascular referral, but states he will cancel appointment if symptoms resolve before being seen.  - Ambulatory referral to Vascular Surgery   The entirety of the information documented in the History of Present Illness, Review of Systems and Physical Exam were personally obtained by me. Portions of this information were initially documented by Idelle Jo, CMA and reviewed by me for thoroughness and accuracy.   Lelon Huh, MD  [facility]

## 2019-08-27 ENCOUNTER — Ambulatory Visit: Payer: 59 | Admitting: Family Medicine

## 2019-09-10 ENCOUNTER — Encounter (INDEPENDENT_AMBULATORY_CARE_PROVIDER_SITE_OTHER): Payer: 59

## 2019-09-10 ENCOUNTER — Encounter (INDEPENDENT_AMBULATORY_CARE_PROVIDER_SITE_OTHER): Payer: 59 | Admitting: Vascular Surgery

## 2019-09-11 ENCOUNTER — Ambulatory Visit: Payer: 59 | Admitting: Dermatology

## 2019-09-11 ENCOUNTER — Other Ambulatory Visit: Payer: Self-pay

## 2019-09-11 ENCOUNTER — Encounter: Payer: Self-pay | Admitting: Dermatology

## 2019-09-11 DIAGNOSIS — L578 Other skin changes due to chronic exposure to nonionizing radiation: Secondary | ICD-10-CM

## 2019-09-11 DIAGNOSIS — L570 Actinic keratosis: Secondary | ICD-10-CM

## 2019-09-11 DIAGNOSIS — L814 Other melanin hyperpigmentation: Secondary | ICD-10-CM | POA: Diagnosis not present

## 2019-09-11 NOTE — Progress Notes (Addendum)
   Follow-Up Visit   Subjective  Kevin Crawford is a 59 y.o. male who presents for the following: skin lesion (right bottom lip, for a few months, prior LN2 treatment with improvement but then it came back, non-tender.) and Seborrheic Dermatitis (Improved, comes and goes, using Ketoconazole cream, HC 2.5% cream prn flares only).   The following portions of the chart were reviewed this encounter and updated as appropriate:     Review of Systems: No other skin or systemic complaints.  Objective  Well appearing patient in no apparent distress; mood and affect are within normal limits.  A focused examination was performed including head, including the scalp, face, neck, nose, ears, eyelids, and lips and right hand. Relevant physical exam findings are noted in the Assessment and Plan.  Objective  Right Lower Cutaneous Lip: Erythematous thin papules/macules with gritty scale at right lower mucosal lip  Objective  face, arms: Diffuse scaly erythematous macules with underlying dyspigmentation.   Objective  Right Dorsal Hand: 0.5 cm med brown macule  Assessment & Plan  Actinic keratosis Right Lower Cutaneous Lip  Discussed cryotherapy vs 5-fluorouracil cream. Discussed expected side effects and benefits of 5-fluorouracil. Patient prefers cryotherapy today.  Destruction of lesion - Right Lower Cutaneous Lip  Destruction method: cryotherapy   Timeout:  patient name, date of birth, surgical site, and procedure verified Lesion destroyed using liquid nitrogen: Yes   Cryotherapy cycles:  3 Outcome: patient tolerated procedure well with no complications   Post-procedure details: wound care instructions given    Actinic skin damage face, arms  Recommend daily broad spectrum sunscreen SPF 30+ to sun-exposed areas, reapply every 2 hours as needed. Call for new or changing lesions.   Lentigines Right Dorsal Hand  Benign-appearing.  Observation.  Call clinic for new or changing  spots   Recommend daily use of broad spectrum spf 30+ sunscreen to sun-exposed areas.    Return in about 6 months (around 03/13/2020) for FBSE.  Marene Lenz, CMA, am acting as scribe for Forest Gleason, MD .  Documentation: I have reviewed the above documentation for accuracy and completeness, and I agree with the above.  Forest Gleason, MD

## 2019-09-11 NOTE — Patient Instructions (Signed)
Cryotherapy Aftercare  . Wash gently with soap and water everyday.   . Apply Vaseline and Band-Aid daily until healed. Recommend daily broad spectrum sunscreen SPF 30+ to sun-exposed areas, reapply every 2 hours as needed. Call for new or changing lesions.  

## 2019-10-16 ENCOUNTER — Ambulatory Visit: Payer: 59 | Admitting: Dermatology

## 2019-10-16 ENCOUNTER — Other Ambulatory Visit: Payer: Self-pay

## 2019-10-16 ENCOUNTER — Encounter: Payer: Self-pay | Admitting: Dermatology

## 2019-10-16 DIAGNOSIS — D229 Melanocytic nevi, unspecified: Secondary | ICD-10-CM

## 2019-10-16 DIAGNOSIS — L820 Inflamed seborrheic keratosis: Secondary | ICD-10-CM

## 2019-10-16 DIAGNOSIS — L578 Other skin changes due to chronic exposure to nonionizing radiation: Secondary | ICD-10-CM | POA: Diagnosis not present

## 2019-10-16 DIAGNOSIS — L570 Actinic keratosis: Secondary | ICD-10-CM

## 2019-10-16 DIAGNOSIS — Z1283 Encounter for screening for malignant neoplasm of skin: Secondary | ICD-10-CM | POA: Diagnosis not present

## 2019-10-16 DIAGNOSIS — D2330 Other benign neoplasm of skin of unspecified part of face: Secondary | ICD-10-CM

## 2019-10-16 DIAGNOSIS — D225 Melanocytic nevi of trunk: Secondary | ICD-10-CM

## 2019-10-16 DIAGNOSIS — L219 Seborrheic dermatitis, unspecified: Secondary | ICD-10-CM | POA: Diagnosis not present

## 2019-10-16 DIAGNOSIS — D2339 Other benign neoplasm of skin of other parts of face: Secondary | ICD-10-CM | POA: Diagnosis not present

## 2019-10-16 DIAGNOSIS — L814 Other melanin hyperpigmentation: Secondary | ICD-10-CM | POA: Diagnosis not present

## 2019-10-16 DIAGNOSIS — L821 Other seborrheic keratosis: Secondary | ICD-10-CM

## 2019-10-16 NOTE — Patient Instructions (Signed)
Seborrheic Dermatitis  Mix hydrocortisone with ketaconazole 2% twice a day. If improved, decrease to hydrocortisone and ketaconazole mixed once a day. If still clear, decrease to ketaconazole only.

## 2019-10-16 NOTE — Progress Notes (Signed)
Follow-Up Visit   Subjective  Kevin Crawford is a 59 y.o. male who presents for the following: Actinic Keratosis (f/u R lower cutaneous lip LN2) and Annual Exam (TBSE).  He also has a pink scaly spot on L upper arm present for about 2 months which he tends to pick at.  Also pink spot on R calf and scaly bump on left lower leg..   The following portions of the chart were reviewed this encounter and updated as appropriate:      Review of Systems:  No other skin or systemic complaints except as noted in HPI or Assessment and Plan.  Objective  Well appearing patient in no apparent distress; mood and affect are within normal limits.  A full examination was performed including scalp, head, eyes, ears, nose, lips, neck, chest, axillae, abdomen, back, buttocks, bilateral upper extremities, bilateral lower extremities, hands, feet, fingers, toes, fingernails, and toenails. All findings within normal limits unless otherwise noted below.  Objective  R lower lip at vermillion x 1: 7.0 x 5.35mm pink keratotic macule, cryo in past x 2- pt states it is improved and smaller/thinner than before  Objective  L chin: 2.60mm firm flesh pap  Objective  Left Upper Arm x 1, L lat calf x 1, R lower calf x 1 (3): Erythematous keratotic waxy stuck-on papule   Objective  spinal lower back: 6.0 x 4.31mm light brown pap darker inferior, slightly waxy  Objective  eyebrows, hairline: Pink scaliness    Assessment & Plan   Skin cancer screening performed today.  Lentigines - Scattered tan macules - Discussed due to sun exposure - Benign, observe - Call for any changes  Actinic Damage - diffuse scaly erythematous macules with underlying dyspigmentation - Recommend daily broad spectrum sunscreen SPF 30+ to sun-exposed areas, reapply every 2 hours as needed.  - Call for new or changing lesions.  Seborrheic Keratoses - Stuck-on, waxy, tan-brown papules and plaques  - Discussed benign etiology and  prognosis. - Observe - Call for any changes  AK (actinic keratosis) R lower lip at vermillion x 1  Residual AK  Discussed if not clear may consider bx on f/u since today will be the third LN2 treatment.  Destruction of lesion - R lower lip at vermillion x 1  Destruction method: cryotherapy   Informed consent: discussed and consent obtained   Lesion destroyed using liquid nitrogen: Yes   Region frozen until ice ball extended beyond lesion: Yes   Outcome: patient tolerated procedure well with no complications   Post-procedure details: wound care instructions given    Fibrous papule of face L chin  Benign-appearing, observe for changes.  Consider shave removal if irritated.  Inflamed seborrheic keratosis (3) Left Upper Arm x 1, L lat calf x 1, R lower calf x 1  (vrs HyAK L upper arm- recheck on f/up)  Destruction of lesion - Left Upper Arm x 1, L lat calf x 1, R lower calf x 1  Destruction method: cryotherapy   Informed consent: discussed and consent obtained   Lesion destroyed using liquid nitrogen: Yes   Region frozen until ice ball extended beyond lesion: Yes   Outcome: patient tolerated procedure well with no complications   Post-procedure details: wound care instructions given    Nevus spinal lower back  Vs SK  Benign-appearing.  Observation.      Seborrheic dermatitis eyebrows, hairline  Restart HC 2.5% cr mixed with Ketoconazole 2% cr qd/bid as directed.  Cont Eucrisa prn, samples  x 2 given to pt.  Return in about 2 months (around 12/16/2019) for Recheck AK R lower lip, ISK L upper arm.    Documentation: I have reviewed the above documentation for accuracy and completeness, and I agree with the above.  Brendolyn Patty, MD   I, Othelia Pulling, RMA, am acting as scribe for Brendolyn Patty, MD .

## 2019-11-12 NOTE — Progress Notes (Signed)
Established patient visit   Patient: Kevin Crawford   DOB: 08-Aug-1960   59 y.o. Male  MRN: [id number] Visit Date: 11/13/2019  Today's healthcare provider: Lelon Huh, MD   Chief Complaint  Patient presents with  . Hypertension  . Hyperlipidemia   Subjective    HPI Hypertension, follow-up  BP Readings from Last 3 Encounters:  11/13/19 116/60  08/26/19 118/78  06/26/19 116/74   Wt Readings from Last 3 Encounters:  11/13/19 198 lb (89.8 kg)  08/26/19 202 lb 9.6 oz (91.9 kg)  06/26/19 195 lb 6.3 oz (88.6 kg)     He was last seen for hypertension 6 months ago.  BP at that visit was 124/79. Management since that visit includes continue same medications.  He reports good compliance with treatment. He is not having side effects.  He is following a Regular diet. He is exercising. He does not smoke.  Use of agents associated with hypertension: NSAIDS.   Outside blood pressures are not checked. Symptoms: No chest pain No chest pressure  No palpitations No syncope  No dyspnea No orthopnea  No paroxysmal nocturnal dyspnea No lower extremity edema   Pertinent labs: Lab Results  Component Value Date   CHOL 165 04/30/2019   HDL 67 04/30/2019   LDLCALC 71 04/30/2019   LDLDIRECT 72 04/30/2019   TRIG 163 (H) 04/30/2019   CHOLHDL 2.5 04/30/2019   Lab Results  Component Value Date   NA 141 04/30/2019   K 4.6 04/30/2019   CREATININE 0.84 04/30/2019   GFRNONAA 98 04/30/2019   GFRAA 113 04/30/2019   GLUCOSE 110 (H) 04/30/2019     The 10-year ASCVD risk score Mikey Bussing DC Jr., et al., 2013) is: 4.2%   ---------------------------------------------------------------------------------------------------  Lipid/Cholesterol, Follow-up  Last lipid panel Other pertinent labs  Lab Results  Component Value Date   CHOL 165 04/30/2019   HDL 67 04/30/2019   LDLCALC 71 04/30/2019   LDLDIRECT 72 04/30/2019   TRIG 163 (H) 04/30/2019   CHOLHDL 2.5 04/30/2019   Lab  Results  Component Value Date   ALT 47 (H) 04/30/2019   AST 29 04/30/2019   PLT 195 04/30/2019   TSH 1.800 04/30/2019     He was last seen for this 1 years ago.  Management since that visit includes no changes.  He reports good compliance with treatment. He is not having side effects.   Symptoms: No chest pain No chest pressure/discomfort  No dyspnea No lower extremity edema  No numbness or tingling of extremity No orthopnea  No palpitations No paroxysmal nocturnal dyspnea  No speech difficulty No syncope   Current diet: in general, a "healthy" diet  , well balanced Current exercise: walking  The 10-year ASCVD risk score Mikey Bussing DC Jr., et al., 2013) is: 4.2%  --------------------------------------------------------------------------------------------------- He also reports having several family members with pancreatic cancer and asks if there is any screening labs.      Medications: Outpatient Medications Prior to Visit  Medication Sig  . amLODipine-benazepril (LOTREL) 5-20 MG capsule Take 1 capsule by mouth daily.  Marland Kitchen aspirin 81 MG EC tablet Take 81 mg by mouth daily. Swallow whole.  Marland Kitchen atorvastatin (LIPITOR) 10 MG tablet Take 1 tablet (10 mg total) by mouth daily.  . hydrocortisone 2.5 % cream APPLY TOPICALLY 2 (TWO) TIMES DAILY APPLY AFTER BM  . ketoconazole (NIZORAL) 2 % cream Apply  a small amount to affected area as directed  apply qd to aa face until clear  .  Multiple Vitamin (MULTIVITAMIN) tablet Take 1 tablet by mouth daily.  . Omega-3 Fatty Acids (FISH OIL) 1000 MG CAPS Take 1,000 mg by mouth daily.   Marland Kitchen OVER THE COUNTER MEDICATION Apply 1 application topically daily as needed (for pain). CBD Lotion  . tadalafil (CIALIS) 5 MG tablet TAKE 1 TABLET BY MOUTH TWICE WEEKLY   No facility-administered medications prior to visit.    Review of Systems  Constitutional: Negative for appetite change, chills and fever.  Respiratory: Negative for chest tightness, shortness of  breath and wheezing.   Cardiovascular: Negative for chest pain and palpitations.  Gastrointestinal: Negative for abdominal pain, nausea and vomiting.      Objective    BP 116/60 (BP Location: Left Arm, Patient Position: Sitting, Cuff Size: Large)   Pulse 68   Temp (!) 97.3 F (36.3 C) (Temporal)   Resp 16   Wt 198 lb (89.8 kg)   SpO2 98% Comment: room air  BMI 29.24 kg/m    Physical Exam  General appearance:  Overweight male, cooperative and in no acute distress Head: Normocephalic, without obvious abnormality, atraumatic Respiratory: Respirations even and unlabored, normal respiratory rate Extremities: All extremities are intact.  Skin: Skin color, texture, turgor normal. No rashes seen  Psych: Appropriate mood and affect. Neurologic: Mental status: Alert, oriented to person, place, and time, thought content appropriate.   No results found for any visits on 11/13/19.  Assessment & Plan      1. Essential hypertension Well controlled.  Continue current medications.    2. Coronary artery disease involving native coronary artery of native heart without angina pectoris Doing well on current statin and is asymptomatic.  - Comprehensive metabolic panel - Lipid panel - Direct LDL  3. Family history of pancreatic cancer  - Lipase   No follow-ups on file.      The entirety of the information documented in the History of Present Illness, Review of Systems and Physical Exam were personally obtained by me. Portions of this information were initially documented by the CMA and reviewed by me for thoroughness and accuracy.      Lelon Huh, MD  [facility] [phone] (phone) [phone] (fax)  [place]

## 2019-11-13 ENCOUNTER — Ambulatory Visit: Payer: 59 | Admitting: Family Medicine

## 2019-11-13 ENCOUNTER — Other Ambulatory Visit: Payer: Self-pay

## 2019-11-13 ENCOUNTER — Encounter: Payer: Self-pay | Admitting: Family Medicine

## 2019-11-13 VITALS — BP 116/60 | HR 68 | Temp 97.3°F | Resp 16 | Wt 198.0 lb

## 2019-11-13 DIAGNOSIS — I2510 Atherosclerotic heart disease of native coronary artery without angina pectoris: Secondary | ICD-10-CM

## 2019-11-13 DIAGNOSIS — Z800 Family history of malignant neoplasm of digestive organs: Secondary | ICD-10-CM | POA: Diagnosis not present

## 2019-11-13 DIAGNOSIS — I10 Essential (primary) hypertension: Secondary | ICD-10-CM | POA: Diagnosis not present

## 2019-11-14 LAB — COMPREHENSIVE METABOLIC PANEL
ALT: 30 IU/L (ref 0–44)
AST: 29 IU/L (ref 0–40)
Albumin/Globulin Ratio: 2 (ref 1.2–2.2)
Albumin: 4.7 g/dL (ref 3.8–4.9)
Alkaline Phosphatase: 61 IU/L (ref 48–121)
BUN/Creatinine Ratio: 14 (ref 9–20)
BUN: 13 mg/dL (ref 6–24)
Bilirubin Total: 0.8 mg/dL (ref 0.0–1.2)
CO2: 23 mmol/L (ref 20–29)
Calcium: 9.4 mg/dL (ref 8.7–10.2)
Chloride: 104 mmol/L (ref 96–106)
Creatinine, Ser: 0.95 mg/dL (ref 0.76–1.27)
GFR calc Af Amer: 102 mL/min/{1.73_m2} (ref >59)
GFR calc non Af Amer: 88 mL/min/{1.73_m2} (ref >59)
Globulin, Total: 2.3 g/dL (ref 1.5–4.5)
Glucose: 101 mg/dL — ABNORMAL HIGH (ref 65–99)
Potassium: 4.7 mmol/L (ref 3.5–5.2)
Sodium: 141 mmol/L (ref 134–144)
Total Protein: 7 g/dL (ref 6.0–8.5)

## 2019-11-14 LAB — LIPID PANEL
Chol/HDL Ratio: 2.3 ratio (ref 0.0–5.0)
Cholesterol, Total: 131 mg/dL (ref 100–199)
HDL: 58 mg/dL (ref >39)
LDL Chol Calc (NIH): 57 mg/dL (ref 0–99)
Triglycerides: 79 mg/dL (ref 0–149)
VLDL Cholesterol Cal: 16 mg/dL (ref 5–40)

## 2019-11-14 LAB — LDL CHOLESTEROL, DIRECT: LDL Direct: 61 mg/dL (ref 0–99)

## 2019-11-14 LAB — LIPASE: Lipase: 25 U/L (ref 13–78)

## 2019-11-29 DIAGNOSIS — I10 Essential (primary) hypertension: Secondary | ICD-10-CM | POA: Diagnosis not present

## 2019-11-29 DIAGNOSIS — Z9889 Other specified postprocedural states: Secondary | ICD-10-CM | POA: Diagnosis not present

## 2019-11-29 DIAGNOSIS — I2510 Atherosclerotic heart disease of native coronary artery without angina pectoris: Secondary | ICD-10-CM | POA: Diagnosis not present

## 2019-11-29 DIAGNOSIS — E7800 Pure hypercholesterolemia, unspecified: Secondary | ICD-10-CM | POA: Diagnosis not present

## 2019-12-18 ENCOUNTER — Other Ambulatory Visit: Payer: Self-pay

## 2019-12-18 ENCOUNTER — Ambulatory Visit: Payer: 59 | Admitting: Dermatology

## 2019-12-18 DIAGNOSIS — L821 Other seborrheic keratosis: Secondary | ICD-10-CM

## 2019-12-18 DIAGNOSIS — L570 Actinic keratosis: Secondary | ICD-10-CM

## 2019-12-18 NOTE — Progress Notes (Signed)
   Follow-Up Visit   Subjective  Kevin Crawford is a 59 y.o. male who presents for the following: Follow-up.  Patient here today for 2 month AK and ISK follow up. AK is at R lower lip vermillion and ISK's are at L upper arm, L lat calf and R lower calf. Everything has cleared from freezing last visit.  It took multiple treatments to clear the R lower lip. Patient not aware of any new or changing spots.  The following portions of the chart were reviewed this encounter and updated as appropriate:      Review of Systems:  No other skin or systemic complaints except as noted in HPI or Assessment and Plan.  Objective  Well appearing patient in no apparent distress; mood and affect are within normal limits.  A focused examination was performed including face, arms, legs. Relevant physical exam findings are noted in the Assessment and Plan.  Objective  Right Dorsal Hand: 18mm waxy tan macule  Previously txd ISKs on L calf and upper arm are clear  Objective  Right Lower [place]ilion Lip : R lower lip vermillion edge is clear, Ln2 last visit   Assessment & Plan  Seborrheic keratosis Right Dorsal Hand  Benign-appearing.  Observation.  Call clinic for new or changing moles.  Recommend daily use of broad spectrum spf 30+ sunscreen to sun-exposed areas.     Actinic keratosis Right Lower [place]ilion Lip   No evidence of recurrence, call clinic for new or changing lesions.  Rec. Elta MD lip with sunscreen   Return in about 2 months (around 02/17/2020) for with Dr. Laurence Ferrari, recheck AK at lip.  Graciella Belton, RMA, am acting as scribe for Brendolyn Patty, MD .  Documentation: I have reviewed the above documentation for accuracy and completeness, and I agree with the above.  Brendolyn Patty MD

## 2019-12-18 NOTE — Patient Instructions (Signed)
Recommend daily broad spectrum sunscreen SPF 30+ to sun-exposed areas, reapply every 2 hours as needed. Call for new or changing lesions.  

## 2019-12-24 IMAGING — CT CT CHEST W/O CM
2 of 4 series · 15 of 36 positions shown, 18 images · non-contrast
Comparison: Report of do cardiac CTA 09/21/2017 (no images
available).

[HOSPITAL] CTA neck 08/31/2017

CLINICAL DATA: 57-year-old male with intermittent nonproductive
cough for 1 year. 4 mm left lower lobe nodule on cardiac CTA at [facility]
[REDACTED] 09/21/2017.

EXAM:
CT CHEST WITHOUT CONTRAST
TECHNIQUE: Multidetector CT imaging of the chest was performed following the
standard protocol without IV contrast.

[Series 2: chest · axial · 0.72mm/px · z∈[-1251,-943]mm · 12 of 184 slices shown, 15 images (1 of 2)]
[im 15/184  mediastinal]
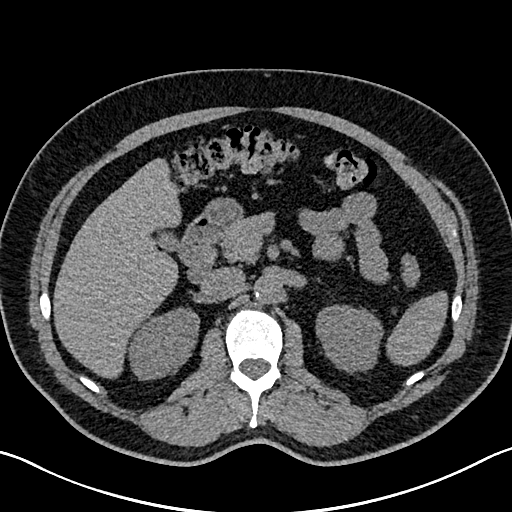
[im 15/184  lung]
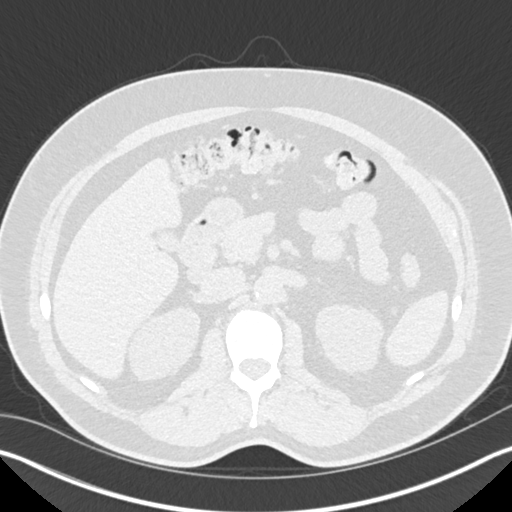
[im 29/184  lung]
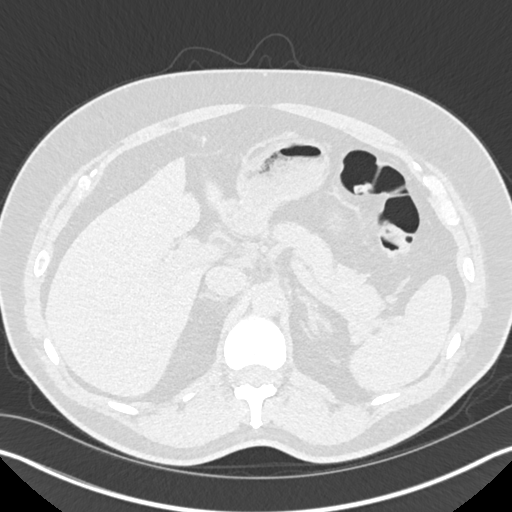
[im 43/184  lung]
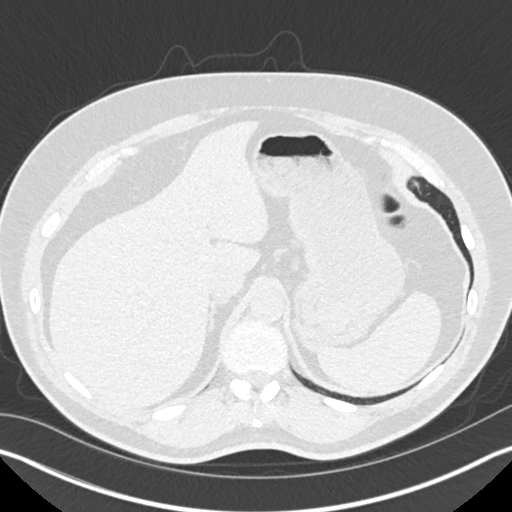
[im 57/184  lung]
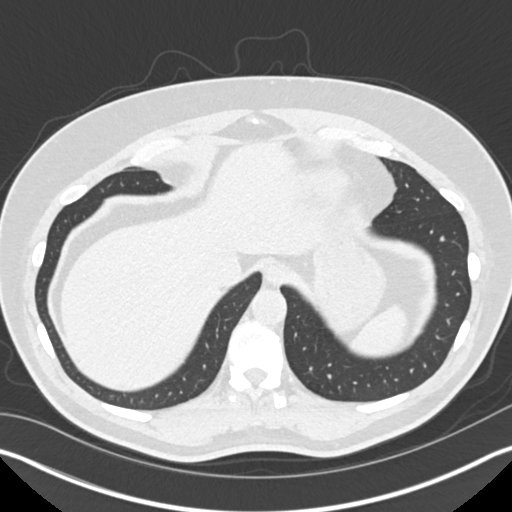
[im 71/184  mediastinal]
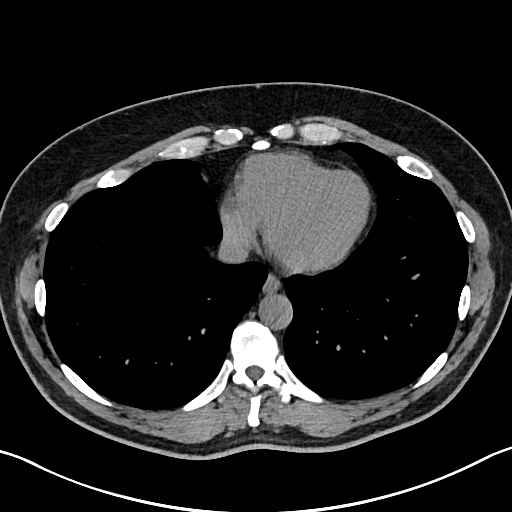
[im 71/184  lung]
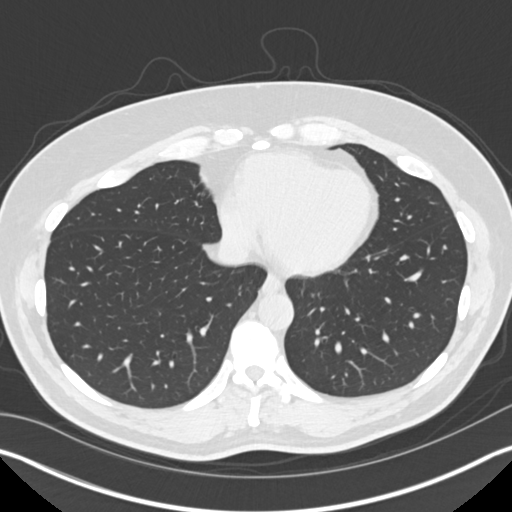
[im 85/184  lung]
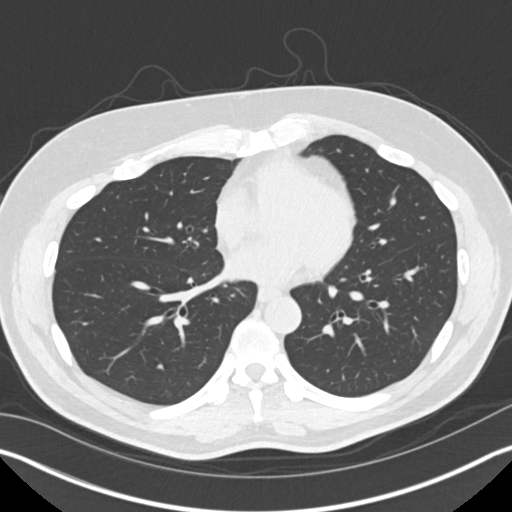
[im 99/184  lung]
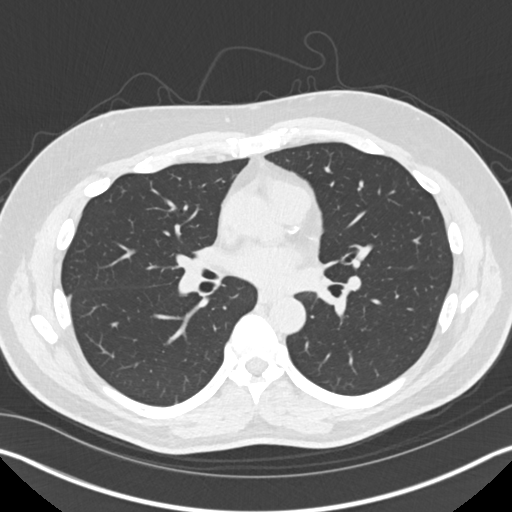
[im 113/184  lung]
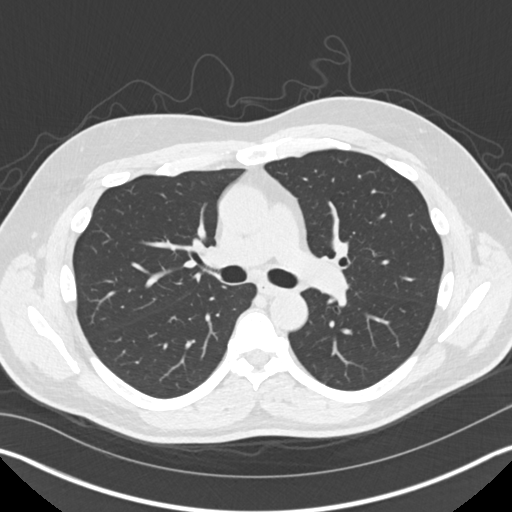
[im 127/184  mediastinal]
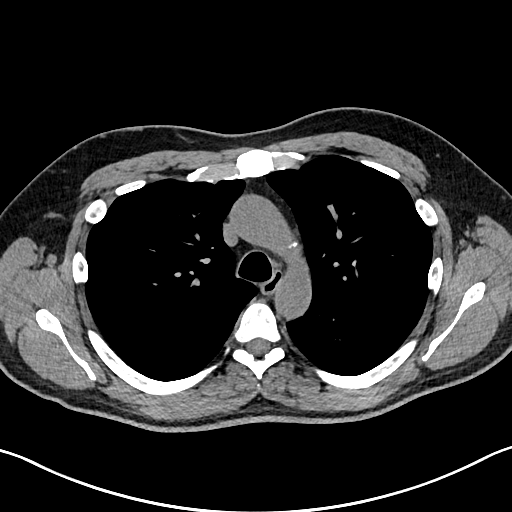
[im 127/184  lung]
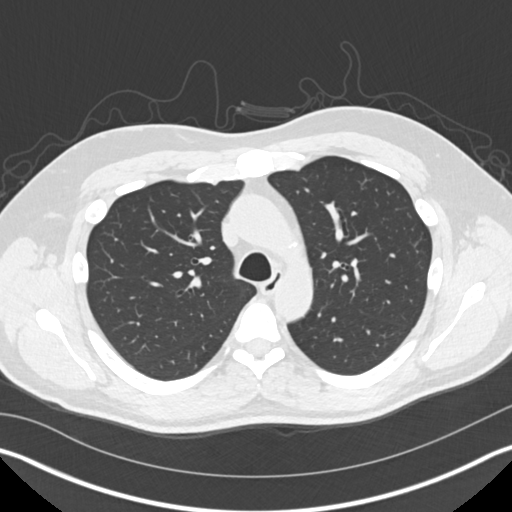
[im 141/184  lung]
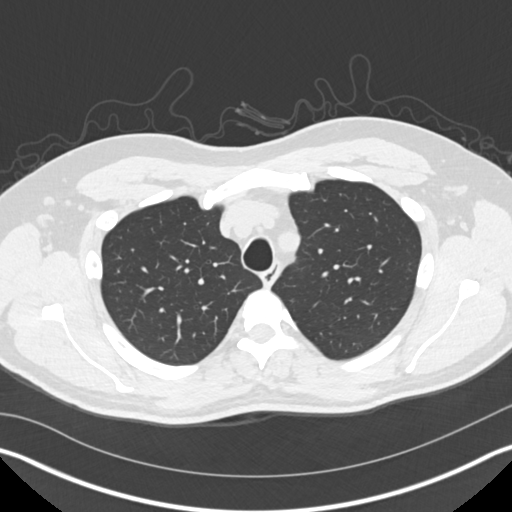
[im 155/184  lung]
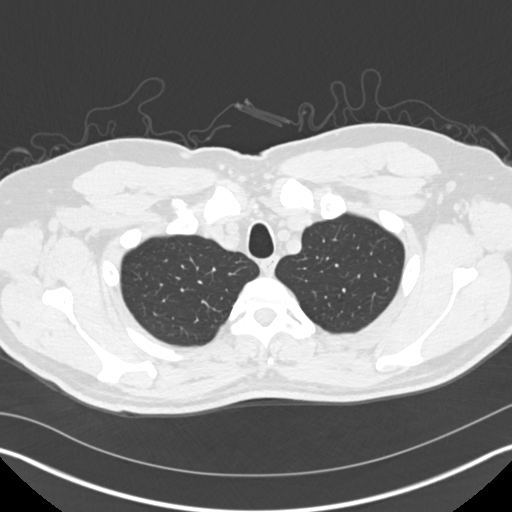
[im 169/184  lung]
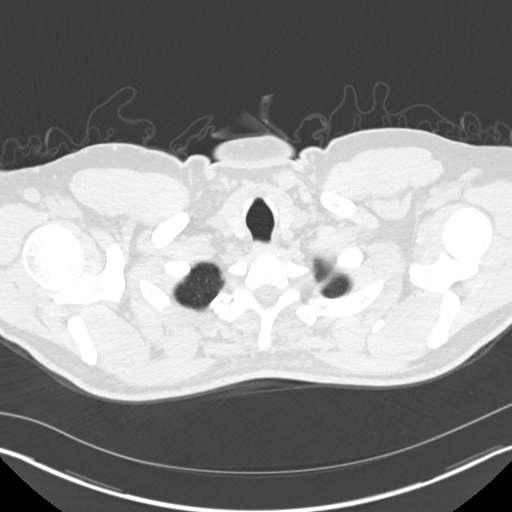

[Series 5: chest · coronal · 0.73mm/px · 3 of 141 slices shown (2 of 2)]
[im 29/141  lung]
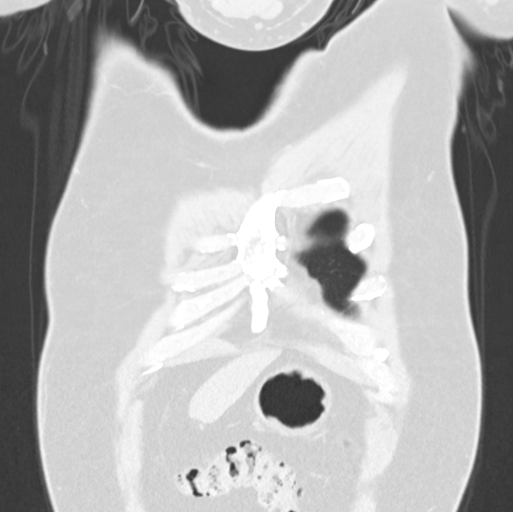
[im 57/141  lung]
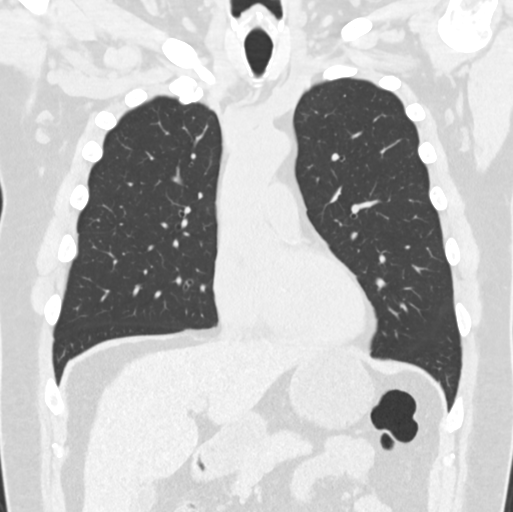
[im 85/141  lung]
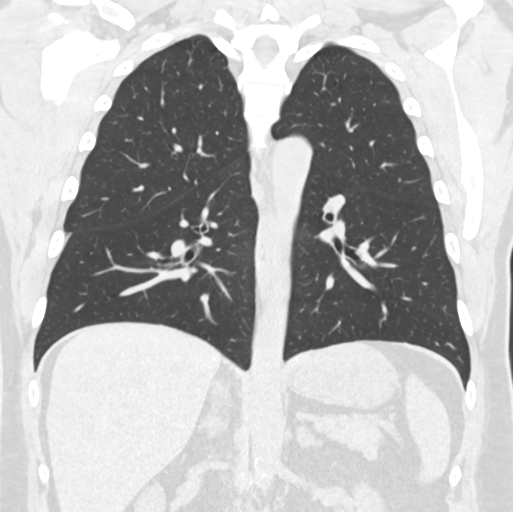

[15 of 36 positions shown; findings below may reference images not displayed]

FINDINGS: Cardiovascular: Calcified coronary artery atherosclerosis
redemonstrated (series 2, image 85). Minimal Calcified aortic
atherosclerosis. Vascular patency is not evaluated in the absence of
IV contrast.

No cardiomegaly or pericardial effusion.

Mediastinum/Nodes: Negative. No lymphadenopathy. Negative
noncontrast thoracic inlet.

Lungs/Pleura: The major airways are patent.

In the left lower lobe posterior basal segment seen on series 3,
image 124 and coronal image 118 there is a 4-5 millimeter pulmonary
nodule.

Both lungs are otherwise clear. No emphysema or other pulmonary
abnormality identified. No pleural effusion.

Upper Abdomen: Negative visible noncontrast liver, gallbladder,
spleen, pancreas, adrenal glands, kidneys, and bowel in the upper
abdomen.

Musculoskeletal: No acute osseous abnormality identified. Generally
mild for age degenerative osseous changes. Prominent right greater
than left 1st rib costochondral osteophytosis.
IMPRESSION: 1. A solitary 4-5 mm left lower lobe posterior basal segment lung
nodule is stable by report since 09/21/2017.
No follow-up needed if patient is low-risk. Non-contrast chest CT
can be considered in 12 months if patient is high-risk.
This recommendation follows the consensus statement: Guidelines for
Management of Incidental Pulmonary Nodules Detected on CT Images:
2. No other pulmonary abnormality.
3. Calcified coronary artery atherosclerosis with minimal calcified
aortic plaque.

## 2020-02-04 ENCOUNTER — Ambulatory Visit: Payer: 59 | Admitting: Dermatology

## 2020-02-06 ENCOUNTER — Encounter: Payer: Self-pay | Admitting: Dermatology

## 2020-02-06 ENCOUNTER — Ambulatory Visit: Payer: 59 | Admitting: Dermatology

## 2020-02-06 ENCOUNTER — Other Ambulatory Visit: Payer: Self-pay

## 2020-02-06 DIAGNOSIS — L570 Actinic keratosis: Secondary | ICD-10-CM

## 2020-02-06 DIAGNOSIS — L219 Seborrheic dermatitis, unspecified: Secondary | ICD-10-CM | POA: Diagnosis not present

## 2020-02-06 MED ORDER — HYDROCORTISONE 2.5 % EX OINT: TOPICAL_OINTMENT | CUTANEOUS | 1 refills | Status: DC

## 2020-02-06 MED ORDER — KETOCONAZOLE 2 % EX SHAM: MEDICATED_SHAMPOO | CUTANEOUS | 1 refills | Status: DC

## 2020-02-06 NOTE — Patient Instructions (Signed)
Recommend daily broad spectrum sunscreen SPF 30+ to sun-exposed areas, reapply every 2 hours as needed. Call for new or changing lesions.  

## 2020-02-06 NOTE — Progress Notes (Signed)
   Follow-Up Visit   Subjective  Kevin Crawford is a 59 y.o. male who presents for the following: Follow-up of AKs.  Patients presents today for follow up on AK at Right lower lip, patient states he may be getting a new on on left lower lip. Patient has no h/o skin cancer  He also reports a rash at his nose that comes and goes and some scaliness at his scalp.    The following portions of the chart were reviewed this encounter and updated as appropriate:  Tobacco  Allergies  Meds  Problems  Med Hx  Surg Hx  Fam Hx      Review of Systems:  No other skin or systemic complaints except as noted in HPI or Assessment and Plan.  Objective  Well appearing patient in no apparent distress; mood and affect are within normal limits.  A focused examination was performed including head, including the scalp, face, neck, nose, ears, eyelids, and lips. Relevant physical exam findings are noted in the Assessment and Plan.  Objective  Lower lip x 2 (2): Erythematous thin papules/macules with gritty scale.   Objective  Right Alar Crease: Pink patches with greasy scale.    Assessment & Plan  AK (actinic keratosis) (2) Lower lip x 2  Cryotherapy today Prior to procedure, discussed risks of blister formation, small wound, skin dyspigmentation, or rare scar following cryotherapy.   Continue Sun protection  If not responsive to cryotherapy will consider 5 FU/Calcipotriene cream in future   Destruction of lesion - Lower lip x 2  Destruction method: cryotherapy   Informed consent: discussed and consent obtained   Lesion destroyed using liquid nitrogen: Yes   Outcome: patient tolerated procedure well with no complications   Post-procedure details: wound care instructions given    Seborrheic dermatitis Right Alar Crease  Continue Ketoconazole cream as needed to face Start HC 2.5% ointment as needed Stop HC 2.5% cream  Start Ketoconazole shampoo apply to affected leave on 10 mins  for up to 3 times a week.  Topical steroids (such as triamcinolone, fluocinolone, fluocinonide, mometasone, clobetasol, halobetasol, betamethasone, hydrocortisone) can cause thinning and lightening of the skin if they are used for too long in the same area. Your physician has selected the right strength medicine for your problem and area affected on the body. Please use your medication only as directed by your physician to prevent side effects.    hydrocortisone 2.5 % ointment - Right Alar Crease  ketoconazole (NIZORAL) 2 % shampoo - Right Alar Crease  Return in about 2 months (around 04/07/2020) for AK Follow up.  IDonzetta Crawford, CMA, am acting as scribe for Kevin Gleason, MD .  Documentation: I have reviewed the above documentation for accuracy and completeness, and I agree with the above.  Kevin Gleason, MD

## 2020-02-10 DIAGNOSIS — H52223 Regular astigmatism, bilateral: Secondary | ICD-10-CM | POA: Diagnosis not present

## 2020-02-10 DIAGNOSIS — H524 Presbyopia: Secondary | ICD-10-CM | POA: Diagnosis not present

## 2020-02-10 DIAGNOSIS — H5203 Hypermetropia, bilateral: Secondary | ICD-10-CM | POA: Diagnosis not present

## 2020-02-10 DIAGNOSIS — Z135 Encounter for screening for eye and ear disorders: Secondary | ICD-10-CM | POA: Diagnosis not present

## 2020-02-17 ENCOUNTER — Encounter: Payer: Self-pay | Admitting: Dermatology

## 2020-03-16 ENCOUNTER — Telehealth: Payer: Self-pay

## 2020-03-16 NOTE — Telephone Encounter (Signed)
Spoke to patient who had some questions concerning covid infusion therapy, all of patient's questions were answered.

## 2020-03-16 NOTE — Telephone Encounter (Signed)
Copied from [facility] [id number]. Topic: General - Inquiry >> Mar 13, 2020 12:40 PM Lennox Solders wrote: Reason for CRM Pt is calling and would like dr Caryn Section nurse to return his call concerning a test and procedure. Pt did not elaborate

## 2020-04-09 ENCOUNTER — Other Ambulatory Visit: Payer: Self-pay

## 2020-04-09 ENCOUNTER — Encounter: Payer: Self-pay | Admitting: Dermatology

## 2020-04-09 ENCOUNTER — Ambulatory Visit: Payer: 59 | Admitting: Dermatology

## 2020-04-09 ENCOUNTER — Other Ambulatory Visit: Payer: Self-pay | Admitting: Dermatology

## 2020-04-09 DIAGNOSIS — L570 Actinic keratosis: Secondary | ICD-10-CM

## 2020-04-09 DIAGNOSIS — L219 Seborrheic dermatitis, unspecified: Secondary | ICD-10-CM

## 2020-04-09 DIAGNOSIS — Z1283 Encounter for screening for malignant neoplasm of skin: Secondary | ICD-10-CM

## 2020-04-09 DIAGNOSIS — L568 Other specified acute skin changes due to ultraviolet radiation: Secondary | ICD-10-CM

## 2020-04-09 DIAGNOSIS — L609 Nail disorder, unspecified: Secondary | ICD-10-CM

## 2020-04-09 DIAGNOSIS — L821 Other seborrheic keratosis: Secondary | ICD-10-CM

## 2020-04-09 DIAGNOSIS — D1800 Hemangioma unspecified site: Secondary | ICD-10-CM

## 2020-04-09 DIAGNOSIS — L578 Other skin changes due to chronic exposure to nonionizing radiation: Secondary | ICD-10-CM

## 2020-04-09 DIAGNOSIS — L814 Other melanin hyperpigmentation: Secondary | ICD-10-CM | POA: Diagnosis not present

## 2020-04-09 DIAGNOSIS — D229 Melanocytic nevi, unspecified: Secondary | ICD-10-CM

## 2020-04-09 NOTE — Progress Notes (Signed)
Follow-Up Visit   Subjective  Kevin Crawford is a 59 y.o. male who presents for the following: Follow-up (Follow up on OV 02/06/20).  Patient presents today for follow up on OV 02/06/20 for AK on B/L lower lip , treated with LN2 at last visit. Patient states that they area still there and flaky.Patient also states he presents today for TBSE. Patient also states that he has an area of concern on his Scrotum that he would like to have evaluated today.    The following portions of the chart were reviewed this encounter and updated as appropriate:  Tobacco  Allergies  Meds  Problems  Med Hx  Surg Hx  Fam Hx      Review of Systems:  No other skin or systemic complaints except as noted in HPI or Assessment and Plan.  Objective  Well appearing patient in no apparent distress; mood and affect are within normal limits.  A full examination was performed including scalp, head, eyes, ears, nose, lips, neck, chest, axillae, abdomen, back, buttocks, bilateral upper extremities, bilateral lower extremities, hands, feet, fingers, toes, fingernails, and toenails. All findings within normal limits unless otherwise noted below.  Objective  Mid Frontal Scalp: Pink patches with greasy scale scalp and eyebrows  Objective  Mid Lower Vermilion Lip (2): Thin scaly pink papules lower lip x 2  Objective  R lower mucosal lip, L lower mucosal lip (2), Right Forehead at hairline: Erythematous thin papules/macules with gritty scale.   Objective  Right Dorsal Hand: 0.5 cm tan macule.  Objective  Left 5th toenail: Tan to brown pigmentation of nail plate with mild nail dystrophy   Assessment & Plan  Seborrheic dermatitis Mid Frontal Scalp  Chronic, stable  Continue Ketoconazole shampoo apply three times per week, massage into scalp and leave in for 10 minutes before rinsing out Continue HC 2.5% ointment twice a day as needed up to one week at face; avoid frequent use Continue Ketoconazole  cream twice a day as needed to face  Other Related Medications hydrocortisone 2.5 % ointment ketoconazole (NIZORAL) 2 % shampoo  Actinic cheilitis (2) Mid Lower Vermilion Lip  Cryotherapy as per AK note Recommend using lip balm with sunscreen, reapply frequently   AK (actinic keratosis) (3) R lower mucosal lip, L lower mucosal lip (2); Right Forehead at hairline  Will defer treatment of forehead to f/u Lip treated today  Cryotherapy today Prior to procedure, discussed risks of blister formation, small wound, skin dyspigmentation, or rare scar following cryotherapy.    Destruction of lesion - Right Forehead at hairline  Destruction method: cryotherapy   Informed consent: discussed and consent obtained   Lesion destroyed using liquid nitrogen: Yes   Cryotherapy cycles:  2 Outcome: patient tolerated procedure well with no complications   Post-procedure details: wound care instructions given    Lentigo Right Dorsal Hand  Benign-appearing.  Observation.  Call clinic for new or changing lesions.  Recommend daily use of broad spectrum spf 30+ sunscreen to sun-exposed areas.    Nail problem Left 5th toenail  Favor fungal infection >> melanocytic lesion  Will send in nail clippings for fungal culture  Call for any darkening or widening  Culture, Fungus with Smear - Left 5th toenail  Lentigines - Scattered tan macules - Discussed due to sun exposure - Benign, observe - Call for any changes  Seborrheic Keratoses - Stuck-on, waxy, tan-brown papules and plaques  - Discussed benign etiology and prognosis. - Observe - Call for any changes  Melanocytic Nevi - Tan-brown and/or pink-flesh-colored symmetric macules and papules - Benign appearing on exam today - Observation - Call clinic for new or changing moles - Recommend daily use of broad spectrum spf 30+ sunscreen to sun-exposed areas.   Hemangiomas - Red papules - Discussed benign nature - Observe - Call for  any changes  Actinic Damage - chronic, stable - diffuse scaly erythematous macules with underlying dyspigmentation - Recommend daily broad spectrum sunscreen SPF 30+ to sun-exposed areas, reapply every 2 hours as needed.  - Call for new or changing lesions.  Skin cancer screening performed today.   Return in about 2 months (around 06/09/2020) for AK Follow up.  IDonzetta Kohut, CMA, am acting as scribe for Forest Gleason, MD .  Documentation: I have reviewed the above documentation for accuracy and completeness, and I agree with the above.  Forest Gleason, MD

## 2020-04-09 NOTE — Patient Instructions (Addendum)

## 2020-04-21 ENCOUNTER — Ambulatory Visit (INDEPENDENT_AMBULATORY_CARE_PROVIDER_SITE_OTHER): Payer: 59 | Admitting: Family Medicine

## 2020-04-21 ENCOUNTER — Other Ambulatory Visit: Payer: Self-pay

## 2020-04-21 ENCOUNTER — Encounter: Payer: Self-pay | Admitting: Family Medicine

## 2020-04-21 VITALS — BP 128/81 | HR 67 | Temp 97.9°F | Ht 69.0 in | Wt 204.6 lb

## 2020-04-21 DIAGNOSIS — E7800 Pure hypercholesterolemia, unspecified: Secondary | ICD-10-CM

## 2020-04-21 DIAGNOSIS — I10 Essential (primary) hypertension: Secondary | ICD-10-CM

## 2020-04-21 DIAGNOSIS — I2510 Atherosclerotic heart disease of native coronary artery without angina pectoris: Secondary | ICD-10-CM | POA: Diagnosis not present

## 2020-04-21 NOTE — Progress Notes (Signed)
Established patient visit   Patient: Kevin Crawford   DOB: [birth year]   59 y.o. Male  MRN: [id number] Visit Date: 04/21/2020  Today's healthcare provider: Vernie Murders, PA   Chief Complaint  Patient presents with   Coronary Artery Disease   Subjective     HPI    Follow up after cardiologist visit require blood work as per patient   Last edited by Jeanelle Malling, CMA on 04/21/2020  8:41 AM. (History)       Past Medical History:  Diagnosis Date   Actinic keratosis    Colon polyp    Duodenal ulcer    H/O colonoscopy 2019   Heart disease    Heart murmur    High cholesterol    Hypertension    Past Surgical History:  Procedure Laterality Date   CARDIAC CATHETERIZATION     COLONOSCOPY WITH PROPOFOL N/A 06/26/2019   Procedure: COLONOSCOPY WITH PROPOFOL;  Surgeon: Toledo, Benay Pike, MD;  Location: [facility];  Service: Gastroenterology;  Laterality: N/A;   ESOPHAGOGASTRODUODENOSCOPY (EGD) WITH PROPOFOL N/A 06/26/2019   Procedure: ESOPHAGOGASTRODUODENOSCOPY (EGD) WITH PROPOFOL;  Surgeon: Toledo, Benay Pike, MD;  Location: [facility];  Service: Gastroenterology;  Laterality: N/A;   INTRAVASCULAR PRESSURE WIRE/FFR STUDY N/A 09/28/2017   Procedure: INTRAVASCULAR PRESSURE WIRE/FFR STUDY;  Surgeon: Isaias Cowman, MD;  Location: [facility];  Service: Cardiovascular;  Laterality: N/A;   LEFT HEART CATH AND CORONARY ANGIOGRAPHY Left 09/28/2017   Procedure: LEFT HEART CATH AND CORONARY ANGIOGRAPHY;  Surgeon: Isaias Cowman, MD;  Location: [facility];  Service: Cardiovascular;  Laterality: Left;   SHOULDER ARTHROSCOPY W/ ROTATOR CUFF REPAIR Right    Social History   Tobacco Use   Smoking status: Former Smoker    Quit date: 06/21/2003    Years since quitting: 16.8   Smokeless tobacco: Former Systems developer    Types: Snuff, Chew    Quit date: 06/21/2003   Tobacco comment: 1-2 ppd for about 25 years  Vaping Use   Vaping Use: Never  used  Substance Use Topics   Alcohol use: Yes    Comment: 5-7 drinks each week; varies   Drug use: No   Family History  Problem Relation Age of Onset   Diabetes Mother        TYPE 2   Hypertension Mother    Hyperlipidemia Mother    Hypertension Father    Hyperlipidemia Father    Lung cancer Maternal Grandmother    Liver cancer Maternal Grandmother    Liver cancer Maternal Grandfather    Pancreatic cancer Paternal Grandmother    Pancreatic cancer Paternal Grandfather    Allergies  Allergen Reactions   Phenergan [Promethazine Hcl] Other (See Comments)    SEIZURES   Mobic [Meloxicam] Other (See Comments)    Severe cramping       Medications: Outpatient Medications Prior to Visit  Medication Sig   amLODipine-benazepril (LOTREL) 5-20 MG capsule Take 1 capsule by mouth daily.   aspirin 81 MG EC tablet Take 81 mg by mouth daily. Swallow whole.   atorvastatin (LIPITOR) 10 MG tablet Take 1 tablet (10 mg total) by mouth daily.   hydrocortisone 2.5 % cream APPLY TOPICALLY 2 (TWO) TIMES DAILY APPLY AFTER BM   hydrocortisone 2.5 % ointment Apply to affected areas as needed   ketoconazole (NIZORAL) 2 % cream Apply  a small amount to affected area as directed  apply qd to aa face until clear   ketoconazole (NIZORAL) 2 %  shampoo Apply to affected areas, leave in for 10 mins then rinsefor up to 3 times a week.   Multiple Vitamin (MULTIVITAMIN) tablet Take 1 tablet by mouth daily.   Omega-3 Fatty Acids (FISH OIL) 1000 MG CAPS Take 1,000 mg by mouth daily.    OVER THE COUNTER MEDICATION Apply 1 application topically daily as needed (for pain). CBD Lotion    tadalafil (CIALIS) 5 MG tablet TAKE 1 TABLET BY MOUTH TWICE WEEKLY   No facility-administered medications prior to visit.    Review of Systems  Constitutional: Negative.   HENT: Negative.   Respiratory: Negative.   Cardiovascular: Negative.   Gastrointestinal: Negative.       Objective    BP 128/81     Pulse 67    Temp 97.9 F (36.6 C) (Oral)    Ht 5\' 9"  (1.753 m)    Wt 204 lb 9.6 oz (92.8 kg) Comment: as per patient 198 lbs--heavy keys and phone   SpO2 100%    BMI 30.21 kg/m  BP Readings from Last 3 Encounters:  04/21/20 128/81  11/13/19 116/60  08/26/19 118/78   Wt Readings from Last 3 Encounters:  04/21/20 204 lb 9.6 oz (92.8 kg)  11/13/19 198 lb (89.8 kg)  08/26/19 202 lb 9.6 oz (91.9 kg)      Physical Exam Constitutional:      General: He is not in acute distress.    Appearance: He is well-developed.  HENT:     Head: Normocephalic and atraumatic.     Right Ear: Hearing normal.     Left Ear: Hearing normal.     Nose: Nose normal.  Eyes:     General: Lids are normal. No scleral icterus.       Right eye: No discharge.        Left eye: No discharge.     Conjunctiva/sclera: Conjunctivae normal.  Neck:     Vascular: No carotid bruit.  Cardiovascular:     Rate and Rhythm: Normal rate and regular rhythm.     Pulses: Normal pulses.     Heart sounds: Normal heart sounds.  Pulmonary:     Effort: Pulmonary effort is normal. No respiratory distress.     Breath sounds: Normal breath sounds.  Musculoskeletal:        General: Normal range of motion.     Cervical back: Normal range of motion and neck supple.  Lymphadenopathy:     Cervical: No cervical adenopathy.  Skin:    Findings: No lesion or rash.  Neurological:     Mental Status: He is alert and oriented to person, place, and time.  Psychiatric:        Speech: Speech normal.        Behavior: Behavior normal.        Thought Content: Thought content normal.     No results found for any visits on 04/21/20.  Assessment & Plan    1. Coronary artery disease involving native coronary artery of native heart without angina pectoris History of 50% blockage in LAD treated by medications without history of stenting. Has an appointment with Dr. Saralyn Pilar (cardiologist) on 05-21-20. Wants to get follow up labs prior to that  appointment time. Denies chest pains, dyspnea or edema. Presently on a statin, Omega-3 Fish Oil and ASA. - CBC with Differential/Platelet - Comprehensive metabolic panel - Lipid panel - TSH - Direct LDL  2. Pure hypercholesterolemia Tolerating Atorvastatin 10 mg qd without side effects. Recheck labs. Continue present medications. -  Comprehensive metabolic panel - Lipid panel - TSH - Direct LDL  3. Primary hypertension Well controlled by Amlodipine 10 mg qd. Recheck routine labs. - CBC with Differential/Platelet - Comprehensive metabolic panel - Lipid panel - TSH - Direct LDL   No follow-ups on file.      Andres Shad, PA, have reviewed all documentation for this visit. The documentation on 04/21/20 for the exam, diagnosis, procedures, and orders are all accurate and complete.    Vernie Murders, [place] [phone] (phone) [phone] (fax)  [place]

## 2020-04-22 LAB — COMPREHENSIVE METABOLIC PANEL WITH GFR
ALT: 45 IU/L — ABNORMAL HIGH (ref 0–44)
AST: 30 IU/L (ref 0–40)
Albumin/Globulin Ratio: 2.1 (ref 1.2–2.2)
Albumin: 5 g/dL — ABNORMAL HIGH (ref 3.8–4.9)
Alkaline Phosphatase: 63 IU/L (ref 44–121)
BUN/Creatinine Ratio: 16 (ref 9–20)
BUN: 12 mg/dL (ref 6–24)
Bilirubin Total: 0.9 mg/dL (ref 0.0–1.2)
CO2: 24 mmol/L (ref 20–29)
Calcium: 9.4 mg/dL (ref 8.7–10.2)
Chloride: 102 mmol/L (ref 96–106)
Creatinine, Ser: 0.74 mg/dL — ABNORMAL LOW (ref 0.76–1.27)
GFR calc Af Amer: 118 mL/min/1.73
GFR calc non Af Amer: 102 mL/min/1.73
Globulin, Total: 2.4 g/dL (ref 1.5–4.5)
Glucose: 95 mg/dL (ref 65–99)
Potassium: 4.7 mmol/L (ref 3.5–5.2)
Sodium: 140 mmol/L (ref 134–144)
Total Protein: 7.4 g/dL (ref 6.0–8.5)

## 2020-04-22 LAB — CBC WITH DIFFERENTIAL/PLATELET
Basophils Absolute: 0.1 x10E3/uL (ref 0.0–0.2)
Basos: 1 %
EOS (ABSOLUTE): 0.4 x10E3/uL (ref 0.0–0.4)
Eos: 8 %
Hematocrit: 44.3 % (ref 37.5–51.0)
Hemoglobin: 15.2 g/dL (ref 13.0–17.7)
Immature Grans (Abs): 0 x10E3/uL (ref 0.0–0.1)
Immature Granulocytes: 0 %
Lymphocytes Absolute: 1.3 x10E3/uL (ref 0.7–3.1)
Lymphs: 26 %
MCH: 33.2 pg — ABNORMAL HIGH (ref 26.6–33.0)
MCHC: 34.3 g/dL (ref 31.5–35.7)
MCV: 97 fL (ref 79–97)
Monocytes Absolute: 0.4 x10E3/uL (ref 0.1–0.9)
Monocytes: 8 %
Neutrophils Absolute: 2.8 x10E3/uL (ref 1.4–7.0)
Neutrophils: 57 %
Platelets: 203 x10E3/uL (ref 150–450)
RBC: 4.58 x10E6/uL (ref 4.14–5.80)
RDW: 12 % (ref 11.6–15.4)
WBC: 4.9 x10E3/uL (ref 3.4–10.8)

## 2020-04-22 LAB — LIPID PANEL
Chol/HDL Ratio: 2.5 ratio (ref 0.0–5.0)
Cholesterol, Total: 162 mg/dL (ref 100–199)
HDL: 66 mg/dL
LDL Chol Calc (NIH): 71 mg/dL (ref 0–99)
Triglycerides: 150 mg/dL — ABNORMAL HIGH (ref 0–149)
VLDL Cholesterol Cal: 25 mg/dL (ref 5–40)

## 2020-04-22 LAB — TSH: TSH: 1.93 u[IU]/mL (ref 0.450–4.500)

## 2020-04-22 LAB — LDL CHOLESTEROL, DIRECT: LDL Direct: 77 mg/dL (ref 0–99)

## 2020-05-06 LAB — FUNGUS CULTURE W SMEAR

## 2020-05-25 ENCOUNTER — Telehealth: Payer: Self-pay

## 2020-05-25 NOTE — Telephone Encounter (Signed)
Patient advised culture did not show any fungus, will recheck at follow up next month but to RTC if any changes prior to follow up appt, JS

## 2020-05-25 NOTE — Telephone Encounter (Signed)
-----   Message from Alfonso Patten, MD sent at 05/13/2020  3:59 PM EST ----- No fungal infection found from his toenail.  If he has not seen any changes (dark area at left 5th toenail not widening or otherwise changing), will recheck at his follow-up in January.

## 2020-06-24 ENCOUNTER — Ambulatory Visit: Payer: 59 | Admitting: Dermatology

## 2020-06-24 ENCOUNTER — Encounter: Payer: Self-pay | Admitting: Dermatology

## 2020-06-24 ENCOUNTER — Other Ambulatory Visit: Payer: Self-pay

## 2020-06-24 DIAGNOSIS — Z872 Personal history of diseases of the skin and subcutaneous tissue: Secondary | ICD-10-CM

## 2020-06-24 DIAGNOSIS — L578 Other skin changes due to chronic exposure to nonionizing radiation: Secondary | ICD-10-CM

## 2020-06-24 MED ORDER — FLUOROURACIL 5 % EX CREA: TOPICAL_CREAM | Freq: Every day | CUTANEOUS | 0 refills | Status: DC

## 2020-06-24 NOTE — Patient Instructions (Addendum)
Recommend daily broad spectrum sunscreen SPF 30+ to sun-exposed areas, reapply every 2 hours as needed. Call for new or changing lesions.   5-Fluorouracil Patient Education   Actinic keratoses are the dry, red scaly spots on the skin caused by sun damage. A portion of these spots can turn into skin cancer with time, and treating them can help prevent development of skin cancer.   Treatment of these spots requires removal of the defective skin cells. There are various ways to remove actinic keratoses, including freezing with liquid nitrogen, treatment with creams, or treatment with a blue light procedure in the office.   5-fluorouracil cream is a topical cream used to treat actinic keratoses. It works by interfering with the growth of abnormal fast-growing skin cells, such as actinic keratoses. These cells peel off and are replaced by healthy ones.   INSTRUCTIONS FOR 5-FLUOROURACIL CREAM:   5-fluorouracil cream typically only needs to be used for 7 days to the lip. A thin layer should be applied once a day to the treatment areas recommended by your physician.   Avoid contact with your eyes, nostrils, and corners of the mouth. Do not use 5-fluorouracil cream on infected or open wounds.   You can use vaseline or aquaphor to the lips as needed during the day.  You will develop redness, irritation and some crusting at areas where you have pre-cancer damage/actinic keratoses. IF YOU DEVELOP PAIN, BLEEDING, OR SIGNIFICANT CRUSTING, STOP THE TREATMENT EARLY - you have already gotten a good response and the actinic keratoses should clear up well.  Wash your hands after applying 5-fluorouracil 5% cream on your skin.   A moisturizer or sunscreen with a minimum SPF 30 should be applied each morning.   Once you have finished the treatment, you can apply a thin layer of Vaseline twice a day to irritated areas to soothe and calm the areas more quickly. If you experience significant discomfort, contact your  physician.  For some patients it is necessary to repeat the treatment for best results.  SIDE EFFECTS: When using 5-fluorouracil cream, you may have mild irritation, such as redness, dryness, swelling, or a mild burning sensation. This usually resolves within 2 weeks. The more actinic keratoses you have, the more redness and inflammation you can expect during treatment. Eye irritation has been reported rarely. If this occurs, please let us know.   If you have any trouble using this cream, please call the office. If you have any other questions about this information, please do not hesitate to ask me before you leave the office.  Melanoma ABCDEs  Melanoma is the most dangerous type of skin cancer, and is the leading cause of death from skin disease.  You are more likely to develop melanoma if you:  Have light-colored skin, light-colored eyes, or red or blond hair  Spend a lot of time in the sun  Tan regularly, either outdoors or in a tanning bed  Have had blistering sunburns, especially during childhood  Have a close family member who has had a melanoma  Have atypical moles or large birthmarks  Early detection of melanoma is key since treatment is typically straightforward and cure rates are extremely high if we catch it early.   The first sign of melanoma is often a change in a mole or a new dark spot.  The ABCDE system is a way of remembering the signs of melanoma.  A for asymmetry:  The two halves do not match. B for border:  The  edges of the growth are irregular. C for color:  A mixture of colors are present instead of an even brown color. D for diameter:  Melanomas are usually (but not always) greater than 38mm - the size of a pencil eraser. E for evolution:  The spot keeps changing in size, shape, and color.  Please check your skin once per month between visits. You can use a small mirror in front and a large mirror behind you to keep an eye on the back side or your body.   If  you see any new or changing lesions before your next follow-up, please call to schedule a visit.  Please continue daily skin protection including broad spectrum sunscreen SPF 30+ to sun-exposed areas, reapplying every 2 hours as needed when you're outdoors.

## 2020-06-24 NOTE — Progress Notes (Signed)
   Follow-Up Visit   Subjective  Kevin Crawford is a 60 y.o. male who presents for the following: Actinic Keratosis (2 month AK follow up. Patient last seen on 10/21 and AK on right forehead at hairline , right lower mucosal lip, and left lower mucosal lip x 2. Patient states areas are about the same and feels area on left lower mucosal lip has gotten worse. Lips has been giving trouble over one year.  The following portions of the chart were reviewed this encounter and updated as appropriate:  Tobacco  Allergies  Meds  Problems  Med Hx  Surg Hx  Fam Hx      Objective  Well appearing patient in no apparent distress; mood and affect are within normal limits.  A focused examination was performed including lips. Relevant physical exam findings are noted in the Assessment and Plan.  Objective  Mid Lower [place]ilion Lip, Mid Upper Vermilion Lip: Thin scaly pink plaques at lip  Assessment & Plan    History of PreCancerous Actinic Keratosis  - site of PreCancerous Actinic Keratosis at right forehead at hairline clear today. - these may recur and new lesions may form requiring treatment to prevent transformation into skin cancer - observe for new or changing spots and contact [facility] for appointment if occur - photoprotection with sun protective clothing; sunglasses and broad spectrum sunscreen with SPF of at least 30 + and frequent self skin exams recommended - yearly exams by a dermatologist recommended for persons with history of PreCancerous Actinic Keratoses  Actinic skin damage (2) Mid Upper [place]ilion Lip; Mid Lower [place]ilion Lip  Severe, chronic, secondary to cumulative UV radiation exposure over time - diffuse scaly erythematous macules - Discussed Prescription "Field Treatment" for Severe, Chronic Confluent Actinic Changes with Pre-Cancerous Actinic Keratoses Field treatment involves treatment of an entire area of skin that has confluent Actinic Changes (Sun/  Ultraviolet light damage) and PreCancerous Actinic Keratoses by method of PhotoDynamic Therapy (PDT) and/or prescription Topical Chemotherapy agents such as 5-fluorouracil, 5-fluorouracil/calcipotriene, and/or imiquimod.  The purpose is to decrease the number of clinically evident and subclinical PreCancerous lesions to prevent progression to development of skin cancer by chemically destroying early precancer changes that may or may not be visible.  It has been shown to reduce the risk of developing skin cancer in the treated area. As a result of treatment, redness, scaling, crusting, and open sores may occur during treatment course. One or more than one of these methods may be used and may have to be used several times to control, suppress and eliminate the PreCancerous changes. Discussed treatment course, expected reaction, and possible side effects. - Recommend daily broad spectrum sunscreen SPF 30+ to sun-exposed areas, reapply every 2 hours as needed.  - Call for new or changing lesions. - Start 5-fluorouracil cream daily for 7 days to affected areas including lower lip and mid upper lip.  Reviewed expected reaction. Handout provided.  Ordered Medications: fluorouracil (EFUDEX) 5 % cream   Return in about 1 month (around 07/25/2020) for ak followup .  I, Asher Muir, CMA, am acting as scribe for Darden Dates, MD.   Documentation: I have reviewed the above documentation for accuracy and completeness, and I agree with the above.  Darden Dates, MD

## 2020-07-07 ENCOUNTER — Other Ambulatory Visit: Payer: Self-pay | Admitting: Family Medicine

## 2020-07-07 DIAGNOSIS — I10 Essential (primary) hypertension: Secondary | ICD-10-CM

## 2020-07-22 ENCOUNTER — Ambulatory Visit: Payer: 59 | Admitting: Dermatology

## 2020-07-22 ENCOUNTER — Other Ambulatory Visit: Payer: Self-pay

## 2020-07-22 DIAGNOSIS — L219 Seborrheic dermatitis, unspecified: Secondary | ICD-10-CM | POA: Diagnosis not present

## 2020-07-22 DIAGNOSIS — L578 Other skin changes due to chronic exposure to nonionizing radiation: Secondary | ICD-10-CM | POA: Diagnosis not present

## 2020-07-22 NOTE — Progress Notes (Signed)
   Follow-Up Visit   Subjective  Kevin Crawford is a 60 y.o. male who presents for the following: Actinic Keratosis (Lower lip, mid upper lip - S/P 6 days of 5FU QD, significant reaction per patient, he wasn't able to tolerate 7 days due to being crusty and painful). Patient's seb derm does flare occasionally. He is using Ketoconazole 2% shampoo, Ketoconazole 2% cream, and HC 2.5% cream a couple times per month.   The following portions of the chart were reviewed this encounter and updated as appropriate:   Tobacco  Allergies  Meds  Problems  Med Hx  Surg Hx  Fam Hx      Review of Systems:  No other skin or systemic complaints except as noted in HPI or Assessment and Plan.  Objective  Well appearing patient in no apparent distress; mood and affect are within normal limits.  A focused examination was performed including the face and scalp. Relevant physical exam findings are noted in the Assessment and Plan.  Objective  Face and scalp: Erythematous, scaly patches involving the ankle and distal lower leg with associated lower leg edema.   Assessment & Plan  Seborrheic dermatitis Face and scalp  Chronic condition with duration or expected duration over one year. Currently well-controlled.  Continue Ketoconazole 2% shampoo to aa's QD PRN - Let sit 5-10 minutes before washing out.   Continue Ketoconazole 2% cream QD and HC 2.5% cream QD sparingly for flares PRN.     Other Related Medications hydrocortisone 2.5 % ointment ketoconazole (NIZORAL) 2 % shampoo   Actinic Damage - Chronic, secondary to cumulative UV radiation exposure over time - Improved s/p prescription 5FU to lip, currently healing. Had to stop treatment early due to significant inflammation - diffuse scaly erythematous macules and papules with underlying dyspigmentation- Recommend daily broad spectrum sunscreen SPF 30+ to sun-exposed areas, reapply every 2 hours as needed.  - Call for new or changing  lesions. - Allow upper and lower lip a few more weeks to heal. If lesions recur restart 5FU QD until crusty, then stop.   Return in about 8 months (around 03/21/2021) for TBSE.  Luther Redo, CMA, am acting as scribe for Forest Gleason, MD .  Documentation: I have reviewed the above documentation for accuracy and completeness, and I agree with the above.  Forest Gleason, MD

## 2020-08-17 ENCOUNTER — Encounter: Payer: Self-pay | Admitting: Dermatology

## 2020-09-15 ENCOUNTER — Encounter: Payer: Self-pay | Admitting: Family Medicine

## 2020-09-15 DIAGNOSIS — N529 Male erectile dysfunction, unspecified: Secondary | ICD-10-CM

## 2020-09-16 MED ORDER — TADALAFIL 10 MG PO TABS: 10.0000 mg | ORAL_TABLET | Freq: Every day | ORAL | 5 refills | Status: DC | PRN

## 2020-10-08 ENCOUNTER — Other Ambulatory Visit: Payer: Self-pay | Admitting: Family Medicine

## 2020-10-08 DIAGNOSIS — E785 Hyperlipidemia, unspecified: Secondary | ICD-10-CM

## 2020-11-23 ENCOUNTER — Encounter: Payer: Self-pay | Admitting: Family Medicine

## 2020-11-23 NOTE — Telephone Encounter (Addendum)
He can have one of the blocked acute visit slots on Friday.

## 2020-11-24 NOTE — Telephone Encounter (Signed)
Apt scheduled for 11/27/2020 at 11:20  Thanks,   -Mickel Baas

## 2020-11-27 ENCOUNTER — Encounter: Payer: Self-pay | Admitting: Family Medicine

## 2020-11-27 ENCOUNTER — Other Ambulatory Visit: Payer: Self-pay

## 2020-11-27 ENCOUNTER — Ambulatory Visit (INDEPENDENT_AMBULATORY_CARE_PROVIDER_SITE_OTHER): Payer: 59 | Admitting: Family Medicine

## 2020-11-27 VITALS — BP 121/76 | HR 85 | Wt 206.0 lb

## 2020-11-27 DIAGNOSIS — R1011 Right upper quadrant pain: Secondary | ICD-10-CM | POA: Diagnosis not present

## 2020-11-27 NOTE — Progress Notes (Signed)
Established patient visit   Patient: Kevin Crawford   DOB: Feb 21, 1961   60 y.o. Male  MRN: [id number] Visit Date: 11/27/2020  Today's healthcare provider: Lelon Huh, MD   Chief Complaint  Patient presents with   Follow-up   Abdominal Pain   Subjective    Abdominal Pain This is a new problem. Episode onset: Started about two months ago. The problem has been gradually worsening. The pain is located in the RUQ. The quality of the pain is aching, dull, sharp, burning and tearing. Associated symptoms include nausea. Pertinent negatives include no constipation, diarrhea, fever, frequency, headaches or vomiting. The pain is aggravated by drinking alcohol.      Allergies  Allergen Reactions   Phenergan [Promethazine Hcl] Other (See Comments)    SEIZURES   Mobic [Meloxicam] Other (See Comments)    Severe cramping     Medications: Outpatient Medications Prior to Visit  Medication Sig   amLODipine-benazepril (LOTREL) 5-20 MG capsule TAKE ONE CAPSULE BY MOUTH DAILY   aspirin 81 MG EC tablet Take 81 mg by mouth daily. Swallow whole.   atorvastatin (LIPITOR) 10 MG tablet Take 1 tablet (10 mg total) by mouth daily.   fluorouracil (EFUDEX) 5 % cream Apply topically daily. Use for up to 7 days to affected areas including lower lip and mid upper lip.   hydrocortisone 2.5 % cream APPLY TOPICALLY 2 (TWO) TIMES DAILY APPLY AFTER BM   hydrocortisone 2.5 % ointment Apply to affected areas as needed   ketoconazole (NIZORAL) 2 % cream Apply  a small amount to affected area as directed  apply qd to aa face until clear   ketoconazole (NIZORAL) 2 % shampoo Apply to affected areas, leave in for 10 mins then rinsefor up to 3 times a week.   Multiple Vitamin (MULTIVITAMIN) tablet Take 1 tablet by mouth daily.   Omega-3 Fatty Acids (FISH OIL) 1000 MG CAPS Take 1,000 mg by mouth daily.    OVER THE COUNTER MEDICATION Apply 1 application topically daily as needed (for pain). CBD Lotion     tadalafil (CIALIS) 10 MG tablet Take 1 tablet (10 mg total) by mouth daily as needed.   No facility-administered medications prior to visit.    Review of Systems  Constitutional: Negative.  Negative for fever.  Gastrointestinal:  Positive for abdominal pain and nausea. Negative for abdominal distention, anal bleeding, blood in stool, constipation, diarrhea, rectal pain and vomiting.  Genitourinary:  Negative for frequency.  Neurological:  Negative for dizziness, light-headedness and headaches.      Objective    BP 121/76 (BP Location: Left Arm, Patient Position: Sitting, Cuff Size: Large)   Pulse 85   Wt 206 lb (93.4 kg)   BMI 30.42 kg/m     Physical Exam  General Appearance:    Mildly obese male, alert, cooperative, in no acute distress  Eyes:    PERRL, conjunctiva/corneas clear, EOM's intact       Lungs:     Clear to auscultation bilaterally, respirations unlabored  Heart:    Normal heart rate. Normal rhythm.  2/6  Abdomen:   bowel sounds present and normal in all 4 quadrants, soft, round, or nontender. No CVA tenderness       Assessment & Plan     1. RUQ pain  He does have history of fatty liver disease and previously consumed alcohol on a regular basis, but has not stopped drinking for the last two weeks.  - CBC - Comprehensive  metabolic panel - US ABDOMEN LIMITED RUQ (LIVER/GB); Future - Gamma GT - Lipid panel - Hemoglobin A1c - PT and PTT        The entirety of the information documented in the History of Present Illness, Review of Systems and Physical Exam were personally obtained by me. Portions of this information were initially documented by the CMA and reviewed by me for thoroughness and accuracy.     Lelon Huh, MD  [facility] [phone] (phone) [phone] (fax)  [place]

## 2020-11-28 ENCOUNTER — Encounter: Payer: Self-pay | Admitting: Family Medicine

## 2020-11-28 LAB — COMPREHENSIVE METABOLIC PANEL
ALT: 40 IU/L (ref 0–44)
AST: 28 IU/L (ref 0–40)
Albumin/Globulin Ratio: 2.1 (ref 1.2–2.2)
Albumin: 5 g/dL — ABNORMAL HIGH (ref 3.8–4.9)
Alkaline Phosphatase: 65 IU/L (ref 44–121)
BUN/Creatinine Ratio: 18 (ref 9–20)
BUN: 17 mg/dL (ref 6–24)
Bilirubin Total: 0.9 mg/dL (ref 0.0–1.2)
CO2: 23 mmol/L (ref 20–29)
Calcium: 9.5 mg/dL (ref 8.7–10.2)
Chloride: 101 mmol/L (ref 96–106)
Creatinine, Ser: 0.92 mg/dL (ref 0.76–1.27)
Globulin, Total: 2.4 g/dL (ref 1.5–4.5)
Glucose: 93 mg/dL (ref 65–99)
Potassium: 4.6 mmol/L (ref 3.5–5.2)
Sodium: 138 mmol/L (ref 134–144)
Total Protein: 7.4 g/dL (ref 6.0–8.5)
eGFR: 96 mL/min/{1.73_m2} (ref >59)

## 2020-11-28 LAB — CBC
Hematocrit: 44 % (ref 37.5–51.0)
Hemoglobin: 15.2 g/dL (ref 13.0–17.7)
MCH: 33 pg (ref 26.6–33.0)
MCHC: 34.5 g/dL (ref 31.5–35.7)
MCV: 95 fL (ref 79–97)
Platelets: 221 10*3/uL (ref 150–450)
RBC: 4.61 x10E6/uL (ref 4.14–5.80)
RDW: 12.1 % (ref 11.6–15.4)
WBC: 5.4 10*3/uL (ref 3.4–10.8)

## 2020-11-28 LAB — LIPID PANEL
Chol/HDL Ratio: 2.7 ratio (ref 0.0–5.0)
Cholesterol, Total: 136 mg/dL (ref 100–199)
HDL: 51 mg/dL (ref >39)
LDL Chol Calc (NIH): 62 mg/dL (ref 0–99)
Triglycerides: 132 mg/dL (ref 0–149)
VLDL Cholesterol Cal: 23 mg/dL (ref 5–40)

## 2020-11-28 LAB — HEMOGLOBIN A1C
Est. average glucose Bld gHb Est-mCnc: 114 mg/dL
Hgb A1c MFr Bld: 5.6 % (ref 4.8–5.6)

## 2020-11-28 LAB — GAMMA GT: GGT: 41 IU/L (ref 0–65)

## 2020-11-28 LAB — PT AND PTT
INR: 1 (ref 0.9–1.2)
Prothrombin Time: 10.4 s (ref 9.1–12.0)
aPTT: 28 s (ref 24–33)

## 2020-12-02 ENCOUNTER — Ambulatory Visit: Payer: 59 | Admitting: Family Medicine

## 2020-12-04 ENCOUNTER — Other Ambulatory Visit: Payer: Self-pay | Admitting: Family Medicine

## 2020-12-04 ENCOUNTER — Encounter: Payer: Self-pay | Admitting: Family Medicine

## 2020-12-04 DIAGNOSIS — R1011 Right upper quadrant pain: Secondary | ICD-10-CM

## 2020-12-04 MED ORDER — NIRMATRELVIR/RITONAVIR (PAXLOVID)TABLET: 3.0000 | ORAL_TABLET | Freq: Two times a day (BID) | ORAL | 0 refills | Status: AC

## 2020-12-04 NOTE — Addendum Note (Signed)
Addended by: Birdie Sons on: 12/04/2020 12:43 PM   Modules accepted: Orders

## 2020-12-07 ENCOUNTER — Ambulatory Visit: Payer: 59

## 2020-12-17 ENCOUNTER — Encounter: Payer: Self-pay | Admitting: Family Medicine

## 2021-01-05 ENCOUNTER — Encounter: Payer: Self-pay | Admitting: Family Medicine

## 2021-01-05 ENCOUNTER — Other Ambulatory Visit: Payer: Self-pay | Admitting: Family Medicine

## 2021-01-05 DIAGNOSIS — I10 Essential (primary) hypertension: Secondary | ICD-10-CM

## 2021-01-05 MED ORDER — AMLODIPINE BESY-BENAZEPRIL HCL 5-20 MG PO CAPS: 1.0000 | ORAL_CAPSULE | Freq: Every day | ORAL | 1 refills | Status: DC

## 2021-01-06 ENCOUNTER — Telehealth: Payer: Self-pay

## 2021-01-06 NOTE — Telephone Encounter (Signed)
Copied from [place] [id number]. Topic: General - Other >> Jan 06, 2021  3:00 PM Yvette Rack wrote: Reason for CRM: [facility] Centralized Scheduling requests that the order for ultrasound be sent again because the original order is no longer in the system. fax# [phone]

## 2021-01-07 ENCOUNTER — Other Ambulatory Visit: Payer: Self-pay | Admitting: Family Medicine

## 2021-01-07 ENCOUNTER — Ambulatory Visit: Payer: 59

## 2021-01-07 DIAGNOSIS — R1011 Right upper quadrant pain: Secondary | ICD-10-CM

## 2021-01-07 NOTE — Telephone Encounter (Signed)
I called and advised [facility] centralized scheduling of message below. I was advised that from what they can see, the ultrasound order placed on 12/04/2020 has not been signed off on yet. They need the order signed either electronically or either signed and faxed back to them. I printed off the ultrasound order and placed on your desk for signature in case you aren't able to sign electronically.

## 2021-01-07 NOTE — Telephone Encounter (Signed)
It looks like it is already scheduled 01/27/2021 at 8am, there is no other order for me to cosign. Do they need a new order entered?

## 2021-01-07 NOTE — Telephone Encounter (Signed)
Hmmm.... there's nothing in the EMR for me to sign. I went into Orders Management and released the order.... I don't know if I should have done that or not. This is a first for me.

## 2021-01-07 NOTE — Telephone Encounter (Signed)
I called [facility] scheduling and was advised that the abdominal ultrasound order in patient's chart needs to be signed off on by the provider. Please review order and sign.

## 2021-01-27 ENCOUNTER — Ambulatory Visit: Payer: 59

## 2021-02-18 ENCOUNTER — Other Ambulatory Visit: Payer: Self-pay

## 2021-02-18 ENCOUNTER — Ambulatory Visit: Payer: 59 | Admitting: Dermatology

## 2021-02-18 DIAGNOSIS — L570 Actinic keratosis: Secondary | ICD-10-CM

## 2021-02-18 DIAGNOSIS — L578 Other skin changes due to chronic exposure to nonionizing radiation: Secondary | ICD-10-CM

## 2021-02-18 DIAGNOSIS — Z8619 Personal history of other infectious and parasitic diseases: Secondary | ICD-10-CM | POA: Diagnosis not present

## 2021-02-18 MED ORDER — VALACYCLOVIR HCL 1 G PO TABS: ORAL_TABLET | ORAL | 2 refills | Status: DC

## 2021-02-18 NOTE — Progress Notes (Signed)
   Follow-Up Visit   Subjective  Kevin Crawford is a 60 y.o. male who presents for the following: scaly lesion (On the upper lip - pt states similar to lesions treated on the lower lip with 5FU. Patient would like to discuss treatment options today.).   The following portions of the chart were reviewed this encounter and updated as appropriate:   Tobacco  Allergies  Meds  Problems  Med Hx  Surg Hx  Fam Hx      Review of Systems:  No other skin or systemic complaints except as noted in HPI or Assessment and Plan.  Objective  Well appearing patient in no apparent distress; mood and affect are within normal limits.  A focused examination was performed including the face. Relevant physical exam findings are noted in the Assessment and Plan.  upper mid mucosal lip x 1, L lower mucosal lip x 1 (2) Erythematous thin papules/macules with gritty scale.   Lips Clear today.   Assessment & Plan  AK (actinic keratosis) (2) upper mid mucosal lip x 1, L lower mucosal lip x 1  Prior to procedure, discussed risks of blister formation, small wound, skin dyspigmentation, or rare scar following cryotherapy. Recommend Vaseline ointment to treated areas while healing. Recommend lip balm with sunscreen daily.    Destruction of lesion - upper mid mucosal lip x 1, L lower mucosal lip x 1 Complexity: simple   Destruction method: cryotherapy   Informed consent: discussed and consent obtained   Timeout:  patient name, date of birth, surgical site, and procedure verified Lesion destroyed using liquid nitrogen: Yes   Region frozen until ice ball extended beyond lesion: Yes   Outcome: patient tolerated procedure well with no complications   Post-procedure details: wound care instructions given    History of herpes simplex infection Lips  <5 episodes per year.  Chronic condition with no cure.  Start Valacyclovir 1g 2 po at onset of outbreak then 2 more tabs 12h later. #8 2 RF. Recommend  lip balm with sunscreen daily to help prevent recurrence.  valACYclovir (VALTREX) 1000 MG tablet - Lips Take 2 tabs po at onset of outbreak then 2 more tabs 12h later.  Actinic Damage - chronic, secondary to cumulative UV radiation exposure/sun exposure over time - diffuse scaly erythematous macules with underlying dyspigmentation - Recommend daily broad spectrum sunscreen SPF 30+ to sun-exposed areas, reapply every 2 hours as needed.  - Recommend staying in the shade or wearing long sleeves, sun glasses (UVA+UVB protection) and wide brim hats (4-inch brim around the entire circumference of the hat). - Call for new or changing lesions.   Return for AK f/u in 4-6 weeks and TBSE.  Luther Redo, CMA, am acting as scribe for Forest Gleason, MD .  Documentation: I have reviewed the above documentation for accuracy and completeness, and I agree with the above.  Forest Gleason, MD

## 2021-02-18 NOTE — Patient Instructions (Addendum)
If you have any questions or concerns for your doctor, please call our main line at [phone] and press option 4 to reach your doctor's medical assistant. If no one answers, please leave a voicemail as directed and we will return your call as soon as possible. Messages left after 4 pm will be answered the following business day.   You may also send us a message via MyChart. We typically respond to MyChart messages within 1-2 business days.  For prescription refills, please ask your pharmacy to contact our office. Our fax number is [phone].  If you have an urgent issue when the clinic is closed that cannot wait until the next business day, you can page your doctor at the number below.    Please note that while we do our best to be available for urgent issues outside of office hours, we are not available 24/7.   If you have an urgent issue and are unable to reach us, you may choose to seek medical care at your doctor's office, retail clinic, urgent care center, or emergency room.  If you have a medical emergency, please immediately call 911 or go to the emergency department.  Pager Numbers  - Dr. Kowalski: [phone]  - Dr. Moye: [phone]  - Dr. Stewart: [phone]  In the event of inclement weather, please call our main line at [phone] for an update on the status of any delays or closures.  Dermatology Medication Tips: Please keep the boxes that topical medications come in in order to help keep track of the instructions about where and how to use these. Pharmacies typically print the medication instructions only on the boxes and not directly on the medication tubes.   If your medication is too expensive, please contact our office at [phone] option 4 or send us a message through MyChart.   We are unable to tell what your co-pay for medications will be in advance as this is different depending on your insurance coverage. However, we may be able to find a substitute  medication at lower cost or fill out paperwork to get insurance to cover a needed medication.   If a prior authorization is required to get your medication covered by your insurance company, please allow us 1-2 business days to complete this process.  Drug prices often vary depending on where the prescription is filled and some pharmacies may offer cheaper prices.  The website www.goodrx.com contains coupons for medications through different pharmacies. The prices here do not account for what the cost may be with help from insurance (it may be cheaper with your insurance), but the website can give you the price if you did not use any insurance.  - You can print the associated coupon and take it with your prescription to the pharmacy.  - You may also stop by our office during regular business hours and pick up a GoodRx coupon card.  - If you need your prescription sent electronically to a different pharmacy, notify our office through [facility] MyChart or by phone at [phone] option 4.  Recommend taking Heliocare sun protection supplement daily in sunny weather for additional sun protection. For maximum protection on the sunniest days, you can take up to 2 capsules of regular Heliocare OR take 1 capsule of Heliocare Ultra. For prolonged exposure (such as a full day in the sun), you can repeat your dose of the supplement 4 hours after your first dose. Heliocare can be purchased at [facility] or at www.heliocare.com.    

## 2021-03-01 ENCOUNTER — Encounter
Admission: EM | Disposition: A | Discharge status: Home / Self Care | Payer: Self-pay | Source: Home / Self Care | Attending: Student in an Organized Health Care Education/Training Program

## 2021-03-01 ENCOUNTER — Other Ambulatory Visit: Payer: Self-pay

## 2021-03-01 ENCOUNTER — Emergency Department: Payer: 59

## 2021-03-01 ENCOUNTER — Ambulatory Visit: Payer: Self-pay

## 2021-03-01 ENCOUNTER — Observation Stay: Payer: 59 | Admitting: Anesthesiology

## 2021-03-01 ENCOUNTER — Observation Stay
Admission: EM | Admit: 2021-03-01 | Discharge: 2021-03-02 | Disposition: A | Discharge status: Home / Self Care | Payer: 59 | Attending: General Surgery | Admitting: General Surgery

## 2021-03-01 DIAGNOSIS — Z20822 Contact with and (suspected) exposure to covid-19: Secondary | ICD-10-CM | POA: Insufficient documentation

## 2021-03-01 DIAGNOSIS — K353 Acute appendicitis with localized peritonitis, without perforation or gangrene: Principal | ICD-10-CM | POA: Diagnosis present

## 2021-03-01 DIAGNOSIS — Z79899 Other long term (current) drug therapy: Secondary | ICD-10-CM | POA: Insufficient documentation

## 2021-03-01 DIAGNOSIS — Z87891 Personal history of nicotine dependence: Secondary | ICD-10-CM | POA: Insufficient documentation

## 2021-03-01 DIAGNOSIS — I10 Essential (primary) hypertension: Secondary | ICD-10-CM | POA: Insufficient documentation

## 2021-03-01 DIAGNOSIS — R1031 Right lower quadrant pain: Secondary | ICD-10-CM | POA: Diagnosis present

## 2021-03-01 HISTORY — PX: XI ROBOTIC LAPAROSCOPIC ASSISTED APPENDECTOMY: SHX6877

## 2021-03-01 LAB — COMPREHENSIVE METABOLIC PANEL
ALT: 47 U/L — ABNORMAL HIGH (ref 0–44)
AST: 29 U/L (ref 15–41)
Albumin: 4.8 g/dL (ref 3.5–5.0)
Alkaline Phosphatase: 56 U/L (ref 38–126)
Anion gap: 9 (ref 5–15)
BUN: 14 mg/dL (ref 6–20)
CO2: 25 mmol/L (ref 22–32)
Calcium: 9.7 mg/dL (ref 8.9–10.3)
Chloride: 103 mmol/L (ref 98–111)
Creatinine, Ser: 0.8 mg/dL (ref 0.61–1.24)
GFR, Estimated: >60 mL/min (ref >60)
Glucose, Bld: 109 mg/dL — ABNORMAL HIGH (ref 70–99)
Potassium: 4.2 mmol/L (ref 3.5–5.1)
Sodium: 137 mmol/L (ref 135–145)
Total Bilirubin: 1.4 mg/dL — ABNORMAL HIGH (ref 0.3–1.2)
Total Protein: 7.6 g/dL (ref 6.5–8.1)

## 2021-03-01 LAB — CBC
HCT: 43.2 % (ref 39.0–52.0)
Hemoglobin: 15.7 g/dL (ref 13.0–17.0)
MCH: 34.4 pg — ABNORMAL HIGH (ref 26.0–34.0)
MCHC: 36.3 g/dL — ABNORMAL HIGH (ref 30.0–36.0)
MCV: 94.7 fL (ref 80.0–100.0)
Platelets: 204 10*3/uL (ref 150–400)
RBC: 4.56 MIL/uL (ref 4.22–5.81)
RDW: 12.4 % (ref 11.5–15.5)
WBC: 11.9 10*3/uL — ABNORMAL HIGH (ref 4.0–10.5)
nRBC: 0 % (ref 0.0–0.2)

## 2021-03-01 LAB — URINALYSIS, COMPLETE (UACMP) WITH MICROSCOPIC
Bacteria, UA: NONE SEEN
Bilirubin Urine: NEGATIVE
Glucose, UA: NEGATIVE mg/dL
Hgb urine dipstick: NEGATIVE
Ketones, ur: NEGATIVE mg/dL
Leukocytes,Ua: NEGATIVE
Nitrite: NEGATIVE
Protein, ur: NEGATIVE mg/dL
Specific Gravity, Urine: 1.005 (ref 1.005–1.030)
Squamous Epithelial / HPF: NONE SEEN (ref 0–5)
pH: 6 (ref 5.0–8.0)

## 2021-03-01 LAB — RESP PANEL BY RT-PCR (FLU A&B, COVID) ARPGX2
Influenza A by PCR: NEGATIVE
Influenza B by PCR: NEGATIVE
SARS Coronavirus 2 by RT PCR: NEGATIVE

## 2021-03-01 LAB — LIPASE, BLOOD: Lipase: 27 U/L (ref 11–51)

## 2021-03-01 SURGERY — APPENDECTOMY, ROBOT-ASSISTED, LAPAROSCOPIC
Anesthesia: General

## 2021-03-01 MED ORDER — PANTOPRAZOLE SODIUM 40 MG IV SOLR: 40.0000 mg | Freq: Every day | INTRAVENOUS | Status: DC

## 2021-03-01 MED ORDER — PROPOFOL 10 MG/ML IV BOLUS
INTRAVENOUS | Status: DC | PRN
  Administered 2021-03-01: 170 mg via INTRAVENOUS

## 2021-03-01 MED ORDER — PROPOFOL 10 MG/ML IV BOLUS
INTRAVENOUS | Status: AC
  Filled 2021-03-01: qty 20

## 2021-03-01 MED ORDER — SODIUM CHLORIDE 0.9 % IV BOLUS
500.0000 mL | Freq: Once | INTRAVENOUS | Status: AC
  Administered 2021-03-01: 500 mL via INTRAVENOUS

## 2021-03-01 MED ORDER — SODIUM CHLORIDE 0.9 % IV SOLN: INTRAVENOUS | Status: DC

## 2021-03-01 MED ORDER — AMLODIPINE BESYLATE 5 MG PO TABS
5.0000 mg | ORAL_TABLET | Freq: Every day | ORAL | Status: DC
  Administered 2021-03-02: 5 mg via ORAL
  Filled 2021-03-01: qty 1

## 2021-03-01 MED ORDER — LACTATED RINGERS IV SOLN: INTRAVENOUS | Status: DC | PRN

## 2021-03-01 MED ORDER — MORPHINE SULFATE (PF) 4 MG/ML IV SOLN: 4.0000 mg | INTRAVENOUS | Status: DC | PRN

## 2021-03-01 MED ORDER — PIPERACILLIN-TAZOBACTAM 3.375 G IVPB 30 MIN
3.3750 g | Freq: Once | INTRAVENOUS | Status: AC
  Administered 2021-03-01: 3.375 g via INTRAVENOUS
  Filled 2021-03-01: qty 50

## 2021-03-01 MED ORDER — ENOXAPARIN SODIUM 40 MG/0.4ML IJ SOSY: 40.0000 mg | PREFILLED_SYRINGE | INTRAMUSCULAR | Status: DC

## 2021-03-01 MED ORDER — BENAZEPRIL HCL 20 MG PO TABS
20.0000 mg | ORAL_TABLET | Freq: Every day | ORAL | Status: DC
  Administered 2021-03-02: 20 mg via ORAL
  Filled 2021-03-01: qty 1

## 2021-03-01 MED ORDER — ROCURONIUM BROMIDE 100 MG/10ML IV SOLN
INTRAVENOUS | Status: DC | PRN
  Administered 2021-03-01: 60 mg via INTRAVENOUS

## 2021-03-01 MED ORDER — ROCURONIUM BROMIDE 100 MG/10ML IV SOLN: INTRAVENOUS | Status: DC | PRN

## 2021-03-01 MED ORDER — HYDROCODONE-ACETAMINOPHEN 5-325 MG PO TABS
1.0000 | ORAL_TABLET | ORAL | Status: DC | PRN
  Administered 2021-03-02 (×2): 2 via ORAL
  Filled 2021-03-01: qty 2

## 2021-03-01 MED ORDER — MIDAZOLAM HCL 2 MG/2ML IJ SOLN
INTRAMUSCULAR | Status: DC | PRN
  Administered 2021-03-01: .5 mg via INTRAVENOUS
  Administered 2021-03-01: 1.5 mg via INTRAVENOUS

## 2021-03-01 MED ORDER — FENTANYL CITRATE (PF) 100 MCG/2ML IJ SOLN
INTRAMUSCULAR | Status: DC | PRN
  Administered 2021-03-01 – 2021-03-02 (×4): 50 ug via INTRAVENOUS

## 2021-03-01 MED ORDER — IOHEXOL 350 MG/ML SOLN
80.0000 mL | Freq: Once | INTRAVENOUS | Status: AC | PRN
  Administered 2021-03-01: 80 mL via INTRAVENOUS
  Filled 2021-03-01: qty 80

## 2021-03-01 MED ORDER — ACETAMINOPHEN 325 MG PO TABS: 650.0000 mg | ORAL_TABLET | Freq: Four times a day (QID) | ORAL | Status: DC | PRN

## 2021-03-01 MED ORDER — MIDAZOLAM HCL 2 MG/2ML IJ SOLN
INTRAMUSCULAR | Status: AC
  Filled 2021-03-01: qty 2

## 2021-03-01 MED ORDER — BUPIVACAINE-EPINEPHRINE (PF) 0.5% -1:200000 IJ SOLN
INTRAMUSCULAR | Status: AC
  Filled 2021-03-01: qty 30

## 2021-03-01 MED ORDER — ONDANSETRON HCL 4 MG/2ML IJ SOLN: 4.0000 mg | Freq: Four times a day (QID) | INTRAMUSCULAR | Status: DC | PRN

## 2021-03-01 MED ORDER — ONDANSETRON 4 MG PO TBDP: 4.0000 mg | ORAL_TABLET | Freq: Four times a day (QID) | ORAL | Status: DC | PRN

## 2021-03-01 MED ORDER — SODIUM CHLORIDE 0.9 % IV SOLN: Freq: Once | INTRAVENOUS | Status: DC

## 2021-03-01 MED ORDER — ACETAMINOPHEN 650 MG RE SUPP: 650.0000 mg | Freq: Four times a day (QID) | RECTAL | Status: DC | PRN

## 2021-03-01 MED ORDER — FENTANYL CITRATE (PF) 100 MCG/2ML IJ SOLN
INTRAMUSCULAR | Status: AC
  Filled 2021-03-01: qty 2

## 2021-03-01 MED ORDER — DEXAMETHASONE SODIUM PHOSPHATE 10 MG/ML IJ SOLN
INTRAMUSCULAR | Status: DC | PRN
  Administered 2021-03-01: 10 mg via INTRAVENOUS

## 2021-03-01 MED ORDER — PIPERACILLIN-TAZOBACTAM 3.375 G IVPB
3.3750 g | Freq: Three times a day (TID) | INTRAVENOUS | Status: DC
  Administered 2021-03-02: 3.375 g via INTRAVENOUS
  Filled 2021-03-01: qty 50

## 2021-03-01 SURGICAL SUPPLY — 64 items
BAG INFUSER PRESSURE 100CC (MISCELLANEOUS) IMPLANT
BLADE SURG SZ11 CARB STEEL (BLADE) ×2 IMPLANT
CANNULA REDUC XI 12-8 STAPL (CANNULA) ×1
CANNULA REDUCER 12-8 DVNC XI (CANNULA) ×1 IMPLANT
CHLORAPREP W/TINT 26 (MISCELLANEOUS) ×2 IMPLANT
COVER TIP SHEARS 8 DVNC (MISCELLANEOUS) ×1 IMPLANT
COVER TIP SHEARS 8MM DA VINCI (MISCELLANEOUS) ×1
DEFOGGER SCOPE WARMER CLEARIFY (MISCELLANEOUS) ×2 IMPLANT
DERMABOND ADVANCED (GAUZE/BANDAGES/DRESSINGS) ×1
DERMABOND ADVANCED .7 DNX12 (GAUZE/BANDAGES/DRESSINGS) ×1 IMPLANT
DRAPE ARM DVNC X/XI (DISPOSABLE) ×4 IMPLANT
DRAPE COLUMN DVNC XI (DISPOSABLE) ×1 IMPLANT
DRAPE DA VINCI XI ARM (DISPOSABLE) ×4
DRAPE DA VINCI XI COLUMN (DISPOSABLE) ×1
ELECT REM PT RETURN 9FT ADLT (ELECTROSURGICAL) ×2
ELECTRODE REM PT RTRN 9FT ADLT (ELECTROSURGICAL) ×1 IMPLANT
GAUZE 4X4 16PLY ~~LOC~~+RFID DBL (SPONGE) ×2 IMPLANT
GLOVE SURG SYN 6.5 ES PF (GLOVE) ×6 IMPLANT
GLOVE SURG UNDER POLY LF SZ6.5 (GLOVE) ×6 IMPLANT
GOWN STRL REUS W/ TWL LRG LVL3 (GOWN DISPOSABLE) ×3 IMPLANT
GOWN STRL REUS W/TWL LRG LVL3 (GOWN DISPOSABLE) ×3
GRASPER SUT TROCAR 14GX15 (MISCELLANEOUS) IMPLANT
IRRIGATOR SUCT 8 DISP DVNC XI (IRRIGATION / IRRIGATOR) IMPLANT
IRRIGATOR SUCTION 8MM XI DISP (IRRIGATION / IRRIGATOR)
IV NS 1000ML (IV SOLUTION)
IV NS 1000ML BAXH (IV SOLUTION) IMPLANT
KIT PINK PAD W/HEAD ARE REST (MISCELLANEOUS) ×2 IMPLANT
KIT PINK PAD W/HEAD ARM REST (MISCELLANEOUS) ×1 IMPLANT
LABEL OR SOLS (LABEL) IMPLANT
MANIFOLD NEPTUNE II (INSTRUMENTS) IMPLANT
NEEDLE HYPO 22GX1.5 SAFETY (NEEDLE) ×2 IMPLANT
NEEDLE INSUFFLATION 14GA 120MM (NEEDLE) ×2 IMPLANT
NS IRRIG 500ML POUR BTL (IV SOLUTION) ×2 IMPLANT
OBTURATOR OPTICAL STANDARD 8MM (TROCAR) ×1
OBTURATOR OPTICAL STND 8 DVNC (TROCAR) ×1
OBTURATOR OPTICALSTD 8 DVNC (TROCAR) ×1 IMPLANT
PACK LAP CHOLECYSTECTOMY (MISCELLANEOUS) ×2 IMPLANT
POUCH SPECIMEN RETRIEVAL 10MM (ENDOMECHANICALS) ×2 IMPLANT
RELOAD STAPLER 2.5X45 WHT DVNC (STAPLE) IMPLANT
RELOAD STAPLER 3.5X45 BLU DVNC (STAPLE) IMPLANT
SEAL CANN UNIV 5-8 DVNC XI (MISCELLANEOUS) ×3 IMPLANT
SEAL XI 5MM-8MM UNIVERSAL (MISCELLANEOUS) ×3
SEALER VESSEL DA VINCI XI (MISCELLANEOUS)
SEALER VESSEL EXT DVNC XI (MISCELLANEOUS) IMPLANT
SET TUBE SMOKE EVAC HIGH FLOW (TUBING) ×2 IMPLANT
SLEEVE SCD COMPRESS KNEE MED (STOCKING) ×2 IMPLANT
SOLUTION ELECTROLUBE (MISCELLANEOUS) ×2 IMPLANT
SPONGE T-LAP 4X18 ~~LOC~~+RFID (SPONGE) ×2 IMPLANT
STAPLER 45 DA VINCI SURE FORM (STAPLE)
STAPLER 45 SUREFORM DVNC (STAPLE) IMPLANT
STAPLER CANNULA SEAL DVNC XI (STAPLE) ×1 IMPLANT
STAPLER CANNULA SEAL XI (STAPLE) ×1
STAPLER RELOAD 2.5X45 WHITE (STAPLE)
STAPLER RELOAD 2.5X45 WHT DVNC (STAPLE)
STAPLER RELOAD 3.5X45 BLU DVNC (STAPLE)
STAPLER RELOAD 3.5X45 BLUE (STAPLE)
SUT MNCRL AB 4-0 PS2 18 (SUTURE) ×2 IMPLANT
SUT VIC AB 2-0 SH 27 (SUTURE)
SUT VIC AB 2-0 SH 27XBRD (SUTURE) IMPLANT
SUT VICRYL 0 AB UR-6 (SUTURE) ×2 IMPLANT
SUT VLOC 90 6 CV-15 VIOLET (SUTURE) ×2 IMPLANT
SYR 30ML LL (SYRINGE) ×2 IMPLANT
TRAY FOLEY MTR SLVR 16FR STAT (SET/KITS/TRAYS/PACK) ×2 IMPLANT
WATER STERILE IRR 500ML POUR (IV SOLUTION) IMPLANT

## 2021-03-01 NOTE — Anesthesia Procedure Notes (Signed)
Procedure Name: Intubation Date/Time: 03/01/2021 10:23 PM Performed by: Lendon Colonel, CRNA Pre-anesthesia Checklist: Patient identified, Patient being monitored, Timeout performed, Emergency Drugs available and Suction available Patient Re-evaluated:Patient Re-evaluated prior to induction Oxygen Delivery Method: Circle system utilized Preoxygenation: Pre-oxygenation with 100% oxygen Induction Type: IV induction Ventilation: Mask ventilation without difficulty Laryngoscope Size: McGraph and 4 Grade View: Grade I Tube type: Oral Tube size: 7.5 mm Number of attempts: 1 Airway Equipment and Method: Stylet Placement Confirmation: ETT inserted through vocal cords under direct vision, positive ETCO2 and breath sounds checked- equal and bilateral Secured at: 23 cm Tube secured with: Tape Dental Injury: Teeth and Oropharynx as per pre-operative assessment

## 2021-03-01 NOTE — OR Nursing (Signed)
Yellow band wedding ring, with adjuster, given to wife, Santiago Glad.

## 2021-03-01 NOTE — ED Provider Notes (Signed)
[facility] Emergency Department Provider Note    Event Date/Time   First MD Initiated Contact with Patient 03/01/21 1813     (approximate)  I have reviewed the triage vital signs and the nursing notes.   HISTORY  Chief Complaint Abdominal Pain    HPI Kevin Crawford is a 60 y.o. male presents to the ER for evaluation of right lower quadrant pain he describes as a electricity like feeling that became progressively worse throughout the day.  No measured fevers or chills no flank pain.  Is never had pain like this before.  Denies any trauma.  Does still have his appendix.  Currently rates the pain is mild to moderate.  Declining any pain medication at this time.  Past Medical History:  Diagnosis Date   Actinic keratosis    Colon polyp    Duodenal ulcer    H/O colonoscopy 2019   Heart disease    Heart murmur    High cholesterol    Hypertension    Family History  Problem Relation Age of Onset   Diabetes Mother        TYPE 2   Hypertension Mother    Hyperlipidemia Mother    Hypertension Father    Hyperlipidemia Father    Lung cancer Maternal Grandmother    Liver cancer Maternal Grandmother    Liver cancer Maternal Grandfather    Pancreatic cancer Paternal Grandmother    Pancreatic cancer Paternal Grandfather    Past Surgical History:  Procedure Laterality Date   CARDIAC CATHETERIZATION     COLONOSCOPY WITH PROPOFOL N/A 06/26/2019   Procedure: COLONOSCOPY WITH PROPOFOL;  Surgeon: Toledo, Benay Pike, MD;  Location: [facility] ENDOSCOPY;  Service: Gastroenterology;  Laterality: N/A;   ESOPHAGOGASTRODUODENOSCOPY (EGD) WITH PROPOFOL N/A 06/26/2019   Procedure: ESOPHAGOGASTRODUODENOSCOPY (EGD) WITH PROPOFOL;  Surgeon: Toledo, Benay Pike, MD;  Location: [facility] ENDOSCOPY;  Service: Gastroenterology;  Laterality: N/A;   INTRAVASCULAR PRESSURE WIRE/FFR STUDY N/A 09/28/2017   Procedure: INTRAVASCULAR PRESSURE WIRE/FFR STUDY;  Surgeon: Isaias Cowman, MD;   Location: [place] [facility];  Service: Cardiovascular;  Laterality: N/A;   LEFT HEART CATH AND CORONARY ANGIOGRAPHY Left 09/28/2017   Procedure: LEFT HEART CATH AND CORONARY ANGIOGRAPHY;  Surgeon: Isaias Cowman, MD;  Location: [place] [facility];  Service: Cardiovascular;  Laterality: Left;   SHOULDER ARTHROSCOPY W/ ROTATOR CUFF REPAIR Right    Patient Active Problem List   Diagnosis Date Noted   Acute appendicitis with localized peritonitis 03/01/2021   Erectile dysfunction 07/17/2018   Stopped smoking with greater than 25 pack year history 02/20/2018   Coronary artery disease involving native coronary artery of native heart without angina pectoris 02/07/2018   S/P cardiac catheterization 10/09/2017   Thyroid nodule 09/29/2017   Lung nodule < 6cm on CT 09/29/2017   Solitary thyroid nodule 09/29/2017   GERD (gastroesophageal reflux disease) 06/30/2017   Heart murmur 06/30/2017   Hyperlipidemia 06/30/2017   Hypertension 06/30/2017      Prior to Admission medications   Medication Sig Start Date End Date Taking? Authorizing Provider  amLODipine-benazepril (LOTREL) 5-20 MG capsule Take 1 capsule by mouth daily. 01/05/21  Yes Birdie Sons, MD  aspirin 81 MG EC tablet Take 81 mg by mouth daily. Swallow whole.   Yes [provider]  atorvastatin (LIPITOR) 10 MG tablet Take 1 tablet (10 mg total) by mouth daily. 10/08/20  Yes Birdie Sons, MD  ketoconazole (NIZORAL) 2 % shampoo Apply to affected areas, leave in for  10 mins then rinsefor up to 3 times a week. 02/06/20   Moye, [place], MD  Multiple Vitamin (MULTIVITAMIN) tablet Take 1 tablet by mouth daily.    [provider]  Omega-3 Fatty Acids (FISH OIL) 1000 MG CAPS Take 1,500 mg by mouth daily.    [provider]  OVER THE COUNTER MEDICATION Apply 1 application topically daily as needed (for pain). CBD Lotion     [provider]  tadalafil (CIALIS) 10 MG tablet Take 1 tablet (10 mg  total) by mouth daily as needed. Patient taking differently: Take 10 mg by mouth daily as needed. Twice weekly 09/16/20   Birdie Sons, MD  valACYclovir (VALTREX) 1000 MG tablet Take 2 tabs po at onset of outbreak then 2 more tabs 12h later. 02/18/21   Moye, [place], MD    Allergies Phenergan [promethazine hcl] and Mobic [meloxicam]    Social History Social History   Tobacco Use   Smoking status: Former   Smokeless tobacco: Former    Types: Snuff, Chew    Quit date: 06/21/2003   Tobacco comments:    1-2 ppd for about 25 years  Vaping Use   Vaping Use: Never used  Substance Use Topics   Alcohol use: Yes    Comment: 5-7 drinks each week; varies   Drug use: No    Review of Systems Patient denies headaches, rhinorrhea, blurry vision, numbness, shortness of breath, chest pain, edema, cough, abdominal pain, nausea, vomiting, diarrhea, dysuria, fevers, rashes or hallucinations unless otherwise stated above in HPI. ____________________________________________   PHYSICAL EXAM:  VITAL SIGNS: Vitals:   03/01/21 1704  BP: (!) 150/108  Pulse: 92  Resp: 18  Temp: 98.6 F (37 C)  SpO2: 97%    Constitutional: Alert and oriented.  Eyes: Conjunctivae are normal.  Head: Atraumatic. Nose: No congestion/rhinnorhea. Mouth/Throat: Mucous membranes are moist.   Neck: No stridor. Painless ROM.  Cardiovascular: Normal rate, regular rhythm. Grossly normal heart sounds.  Good peripheral circulation. Respiratory: Normal respiratory effort.  No retractions. Lungs CTAB. Gastrointestinal: Soft with ttp in rlq, no guarding or rebound. No distention. No abdominal bruits. No CVA tenderness. Genitourinary:  Musculoskeletal: No lower extremity tenderness nor edema.  No joint effusions. Neurologic:  Normal speech and language. No gross focal neurologic deficits are appreciated. No facial droop Skin:  Skin is warm, dry and intact. No rash noted. Psychiatric: Mood and affect are normal. Speech and  behavior are normal.  ____________________________________________   LABS (all labs ordered are listed, but only abnormal results are displayed)  Results for orders placed or performed during the hospital encounter of 03/01/21 (from the past 24 hour(s))  Lipase, blood     Status: None   Collection Time: 03/01/21  5:12 PM  Result Value Ref Range   Lipase 27 11 - 51 U/L  Comprehensive metabolic panel     Status: Abnormal   Collection Time: 03/01/21  5:12 PM  Result Value Ref Range   Sodium 137 135 - 145 mmol/L   Potassium 4.2 3.5 - 5.1 mmol/L   Chloride 103 98 - 111 mmol/L   CO2 25 22 - 32 mmol/L   Glucose, Bld 109 (H) 70 - 99 mg/dL   BUN 14 6 - 20 mg/dL   Creatinine, Ser 0.80 0.61 - 1.24 mg/dL   Calcium 9.7 8.9 - 10.3 mg/dL   Total Protein 7.6 6.5 - 8.1 g/dL   Albumin 4.8 3.5 - 5.0 g/dL   AST 29 15 - 41 U/L  ALT 47 (H) 0 - 44 U/L   Alkaline Phosphatase 56 38 - 126 U/L   Total Bilirubin 1.4 (H) 0.3 - 1.2 mg/dL   GFR, Estimated >60 >60 mL/min   Anion gap 9 5 - 15  CBC     Status: Abnormal   Collection Time: 03/01/21  5:12 PM  Result Value Ref Range   WBC 11.9 (H) 4.0 - 10.5 K/uL   RBC 4.56 4.22 - 5.81 MIL/uL   Hemoglobin 15.7 13.0 - 17.0 g/dL   HCT 43.2 39.0 - 52.0 %   MCV 94.7 80.0 - 100.0 fL   MCH 34.4 (H) 26.0 - 34.0 pg   MCHC 36.3 (H) 30.0 - 36.0 g/dL   RDW 12.4 11.5 - 15.5 %   Platelets 204 150 - 400 K/uL   nRBC 0.0 0.0 - 0.2 %  Urinalysis, Complete w Microscopic     Status: Abnormal   Collection Time: 03/01/21  5:12 PM  Result Value Ref Range   Color, Urine YELLOW (A) YELLOW   APPearance CLEAR (A) CLEAR   Specific Gravity, Urine 1.005 1.005 - 1.030   pH 6.0 5.0 - 8.0   Glucose, UA NEGATIVE NEGATIVE mg/dL   Hgb urine dipstick NEGATIVE NEGATIVE   Bilirubin Urine NEGATIVE NEGATIVE   Ketones, ur NEGATIVE NEGATIVE mg/dL   Protein, ur NEGATIVE NEGATIVE mg/dL   Nitrite NEGATIVE NEGATIVE   Leukocytes,Ua NEGATIVE NEGATIVE   RBC / HPF 0-5 0 - 5 RBC/hpf   WBC, UA  0-5 0 - 5 WBC/hpf   Bacteria, UA NONE SEEN NONE SEEN   Squamous Epithelial / LPF NONE SEEN 0 - 5   Mucus PRESENT   Resp Panel by RT-PCR (Flu A&B, Covid) Nasopharyngeal Swab     Status: None   Collection Time: 03/01/21  8:00 PM   Specimen: Nasopharyngeal Swab; Nasopharyngeal(NP) swabs in vial transport medium  Result Value Ref Range   SARS Coronavirus 2 by RT PCR NEGATIVE NEGATIVE   Influenza A by PCR NEGATIVE NEGATIVE   Influenza B by PCR NEGATIVE NEGATIVE   ____________________________________________ ____________________________________________  RADIOLOGY  I personally reviewed all radiographic images ordered to evaluate for the above acute complaints and reviewed radiology reports and findings.  These findings were personally discussed with the patient.  Please see medical record for radiology report.  ____________________________________________   PROCEDURES  Procedure(s) performed:  Procedures    Critical Care performed: no ____________________________________________   INITIAL IMPRESSION / ASSESSMENT AND PLAN / ED COURSE  Pertinent labs & imaging results that were available during my care of the patient were reviewed by me and considered in my medical decision making (see chart for details).   DDX: Appendicitis, colitis, diverticulitis, stone, hernia, cellulitis  Kevin Crawford is a 61 y.o. who presents to the ED with presentation as described above.  Patient with exam history evaluation concerning for appendicitis.  CT imaging ordered for above differential.  Will give IV fluids.  Clinical Course as of 03/01/21 2152  Mon Mar 01, 2021  1946 CT imaging by my review appears consistent with acute appendicitis.  Will consult surgery [PR]    Clinical Course User Index [PR] Merlyn Lot, MD   Patient evaluated bedside by general surgery plan to take to the OR.  Have ordered IV antibiotics.  Patient remains hemodynamically stable for admission.  The patient  was evaluated in Emergency Department today for the symptoms described in the history of present illness. He/she was evaluated in the context of the global COVID-19 pandemic,  which necessitated consideration that the patient might be at risk for infection with the SARS-CoV-2 virus that causes COVID-19. Institutional protocols and algorithms that pertain to the evaluation of patients at risk for COVID-19 are in a state of rapid change based on information released by regulatory bodies including the CDC and federal and state organizations. These policies and algorithms were followed during the patient's care in the ED.  As part of my medical decision making, I reviewed the following data within the [place] notes reviewed and incorporated, Labs reviewed, notes from prior ED visits and [place] Controlled Substance Database   ____________________________________________   FINAL CLINICAL IMPRESSION(S) / ED DIAGNOSES  Final diagnoses:  Acute appendicitis with localized peritonitis without abscess, unspecified whether gangrene present, unspecified whether perforation present      NEW MEDICATIONS STARTED DURING THIS VISIT:  New Prescriptions   No medications on file     Note:  This document was prepared using Dragon voice recognition software and may include unintentional dictation errors.    Merlyn Lot, MD 03/01/21 2152

## 2021-03-01 NOTE — H&P (Signed)
SURGICAL CONSULTATION NOTE   HISTORY OF PRESENT ILLNESS (HPI):  60 y.o. male presented to [facility] ED for evaluation of abdominal pain. Patient reports started with abdominal pain this morning at 1 AM.  Pain started in the paramedical area.  Pain then radiated to the right lower quadrant and suprapubic area.  Pain alleviated with IV pain medications.  No aggravating factors identified.  Denies any fever or chills.  In the ED he was found with leukocytosis.  CT scan of the abdomen and pelvis shows fat stranding around the appendix without free air or free fluid.  I personally evaluated the images.  Surgery is consulted by Dr. Quentin Cornwall in this context for evaluation and management of acute appendicitis.  PAST MEDICAL HISTORY (PMH):  Past Medical History:  Diagnosis Date   Actinic keratosis    Colon polyp    Duodenal ulcer    H/O colonoscopy 2019   Heart disease    Heart murmur    High cholesterol    Hypertension      PAST SURGICAL HISTORY ([place]):  Past Surgical History:  Procedure Laterality Date   CARDIAC CATHETERIZATION     COLONOSCOPY WITH PROPOFOL N/A 06/26/2019   Procedure: COLONOSCOPY WITH PROPOFOL;  Surgeon: Toledo, Benay Pike, MD;  Location: [facility] ENDOSCOPY;  Service: Gastroenterology;  Laterality: N/A;   ESOPHAGOGASTRODUODENOSCOPY (EGD) WITH PROPOFOL N/A 06/26/2019   Procedure: ESOPHAGOGASTRODUODENOSCOPY (EGD) WITH PROPOFOL;  Surgeon: Toledo, Benay Pike, MD;  Location: [facility]COPY;  Service: Gastroenterology;  Laterality: N/A;   INTRAVASCULAR PRESSURE WIRE/FFR STUDY N/A 09/28/2017   Procedure: INTRAVASCULAR PRESSURE WIRE/FFR STUDY;  Surgeon: Isaias Cowman, MD;  Location: [place] [facility];  Service: Cardiovascular;  Laterality: N/A;   LEFT HEART CATH AND CORONARY ANGIOGRAPHY Left 09/28/2017   Procedure: LEFT HEART CATH AND CORONARY ANGIOGRAPHY;  Surgeon: Isaias Cowman, MD;  Location: [place] [facility];  Service: Cardiovascular;  Laterality: Left;   SHOULDER  ARTHROSCOPY W/ ROTATOR CUFF REPAIR Right      MEDICATIONS:  Prior to Admission medications   Medication Sig Start Date End Date Taking? Authorizing Provider  amLODipine-benazepril (LOTREL) 5-20 MG capsule Take 1 capsule by mouth daily. 01/05/21  Yes Birdie Sons, MD  aspirin 81 MG EC tablet Take 81 mg by mouth daily. Swallow whole.   Yes [provider]  atorvastatin (LIPITOR) 10 MG tablet Take 1 tablet (10 mg total) by mouth daily. 10/08/20  Yes Birdie Sons, MD  ketoconazole (NIZORAL) 2 % shampoo Apply to affected areas, leave in for 10 mins then rinsefor up to 3 times a week. 02/06/20   Moye, [place], MD  Multiple Vitamin (MULTIVITAMIN) tablet Take 1 tablet by mouth daily.    [provider]  Omega-3 Fatty Acids (FISH OIL) 1000 MG CAPS Take 1,500 mg by mouth daily.    [provider]  OVER THE COUNTER MEDICATION Apply 1 application topically daily as needed (for pain). CBD Lotion     [provider]  tadalafil (CIALIS) 10 MG tablet Take 1 tablet (10 mg total) by mouth daily as needed. Patient taking differently: Take 10 mg by mouth daily as needed. Twice weekly 09/16/20   Birdie Sons, MD  valACYclovir (VALTREX) 1000 MG tablet Take 2 tabs po at onset of outbreak then 2 more tabs 12h later. 02/18/21   Moye, [place], MD     ALLERGIES:  Allergies  Allergen Reactions   Phenergan [Promethazine Hcl] Other (See Comments)    SEIZURES   Mobic [Meloxicam] Other (See Comments)  Severe cramping     SOCIAL HISTORY:  Social History   Socioeconomic History   Marital status: Married    Spouse name: Not on file   Number of children: Not on file   Years of education: Not on file   Highest education level: Not on file  Occupational History   Occupation: Retired    Comment: Previously worked [employer]   Tobacco Use   Smoking status: Former   Smokeless tobacco: Former    Types: Snuff, Chew    Quit date: 06/21/2003   Tobacco comments:    1-2 ppd  for about 25 years  Vaping Use   Vaping Use: Never used  Substance and Sexual Activity   Alcohol use: Yes    Comment: 5-7 drinks each week; varies   Drug use: No   Sexual activity: Not on file  Other Topics Concern   Not on file  Social History Narrative   Not on file   Social Determinants of Health   Financial Resource Strain: Not on file  Food Insecurity: Not on file  Transportation Needs: Not on file  Physical Activity: Not on file  Stress: Not on file  Social Connections: Not on file  Intimate Partner Violence: Not on file      FAMILY HISTORY:  Family History  Problem Relation Age of Onset   Diabetes Mother        TYPE 2   Hypertension Mother    Hyperlipidemia Mother    Hypertension Father    Hyperlipidemia Father    Lung cancer Maternal Grandmother    Liver cancer Maternal Grandmother    Liver cancer Maternal Grandfather    Pancreatic cancer Paternal Grandmother    Pancreatic cancer Paternal Grandfather      REVIEW OF SYSTEMS:  Constitutional: denies weight loss, fever, chills, or sweats  Eyes: denies any other vision changes, history of eye injury  ENT: denies sore throat, hearing problems  Respiratory: denies shortness of breath, wheezing  Cardiovascular: denies chest pain, palpitations  Gastrointestinal: positive abdominal pain, denies nausea and vomiting Genitourinary: denies burning with urination or urinary frequency Musculoskeletal: denies any other joint pains or cramps  Skin: denies any other rashes or skin discolorations  Neurological: denies any other headache, dizziness, weakness  Psychiatric: denies any other depression, anxiety   All other review of systems were negative   VITAL SIGNS:  Temp:  [98.6 F (37 C)] 98.6 F (37 C) (09/12 1704) Pulse Rate:  [92] 92 (09/12 1704) Resp:  [18] 18 (09/12 1704) BP: (150)/(108) 150/108 (09/12 1704) SpO2:  [97 %] 97 % (09/12 1704) Weight:  [93 kg] 93 kg (09/12 1705)     Height: '5\' 9"'$  (175.3 cm)  Weight: 93 kg BMI (Calculated): 30.26   INTAKE/OUTPUT:  This shift: No intake/output data recorded.  Last 2 shifts: '@IOLAST2SHIFTS'$ @   PHYSICAL EXAM:  Constitutional:  -- Normal body habitus  -- Awake, alert, and oriented x3  Eyes:  -- Pupils equally round and reactive to light  -- No scleral icterus  Ear, nose, and throat:  -- No jugular venous distension  Pulmonary:  -- No crackles  -- Equal breath sounds bilaterally -- Breathing non-labored at rest Cardiovascular:  -- S1, S2 present  -- No pericardial rubs Gastrointestinal:  -- Abdomen soft, tender in the right upper quadrant, non-distended, no guarding or rebound tenderness -- No abdominal masses appreciated, pulsatile or otherwise  Musculoskeletal and Integumentary:  -- Wounds or skin discoloration: None appreciated -- Extremities: B/L UE  and LE FROM, hands and feet warm, no edema  Neurologic:  -- Motor function: intact and symmetric -- Sensation: intact and symmetric   Labs:  CBC Latest Ref Rng & Units 03/01/2021 11/27/2020 04/21/2020  WBC 4.0 - 10.5 K/uL 11.9(H) 5.4 4.9  Hemoglobin 13.0 - 17.0 g/dL 15.7 15.2 15.2  Hematocrit 39.0 - 52.0 % 43.2 44.0 44.3  Platelets 150 - 400 K/uL 204 221 203   CMP Latest Ref Rng & Units 03/01/2021 11/27/2020 04/21/2020  Glucose 70 - 99 mg/dL 109(H) 93 95  BUN 6 - 20 mg/dL '14 17 12  '$ Creatinine 0.61 - 1.24 mg/dL 0.80 0.92 0.74(L)  Sodium 135 - 145 mmol/L 137 138 140  Potassium 3.5 - 5.1 mmol/L 4.2 4.6 4.7  Chloride 98 - 111 mmol/L 103 101 102  CO2 22 - 32 mmol/L '25 23 24  '$ Calcium 8.9 - 10.3 mg/dL 9.7 9.5 9.4  Total Protein 6.5 - 8.1 g/dL 7.6 7.4 7.4  Total Bilirubin 0.3 - 1.2 mg/dL 1.4(H) 0.9 0.9  Alkaline Phos 38 - 126 U/L 56 65 63  AST 15 - 41 U/L '29 28 30  '$ ALT 0 - 44 U/L 47(H) 40 45(H)    Imaging studies:  EXAM: CT ABDOMEN AND PELVIS WITH CONTRAST   TECHNIQUE: Multidetector CT imaging of the abdomen and pelvis was performed using the standard protocol following bolus  administration of intravenous contrast.   CONTRAST:  37m OMNIPAQUE IOHEXOL 350 MG/ML SOLN   COMPARISON:  None.   FINDINGS: Lower chest: No acute abnormality.   Hepatobiliary: There is diffuse fatty infiltration of the liver parenchyma. No focal liver abnormality is seen. No gallstones, gallbladder wall thickening, or biliary dilatation.   Pancreas: Unremarkable. No pancreatic ductal dilatation or surrounding inflammatory changes.   Spleen: Normal in size without focal abnormality.   Adrenals/Urinary Tract: Adrenal glands are unremarkable. Kidneys are normal, without renal calculi, focal lesion, or hydronephrosis. Bladder is unremarkable.   Stomach/Bowel: Stomach is within normal limits. The appendix is mildly thickened and inflamed. There is no evidence of associated perforation or abscess. No evidence of bowel wall thickening, distention, or inflammatory changes.   Vascular/Lymphatic: Aortic atherosclerosis. No enlarged abdominal or pelvic lymph nodes.   Reproductive: Prostate is unremarkable.   Other: No abdominal wall hernia or abnormality. No abdominopelvic ascites.   Musculoskeletal: No acute or significant osseous findings.   IMPRESSION: 1. Findings consistent with early uncomplicated appendicitis. 2. Hepatic steatosis.     Electronically Signed   By: TVirgina NorfolkM.D.   On: 03/01/2021 19:53    Assessment/Plan:  60y.o. male with acute appendicitis, complicated by pertinent comorbidities including hypertension.  Patient with history, physical exam and images consistent with acute appendicitis. Patient oriented about diagnosis and surgical management as treatment. Patient oriented about goals of surgery and its risk including: bowel injury, infection, abscess, bleeding, leak from cecum, intestinal adhesions, bowel obstruction, fistula, injury to the ureter among others.  Patient understood and agreed to proceed with surgery. Will admit patient, already  started on antibiotic therapy, will give IV hydration since patient is NPO and schedule to OR.   EArnold Long MD

## 2021-03-01 NOTE — ED Triage Notes (Signed)
Pt to ED via POV. Pt started having sharp and stabbing RLQ pain at 0100 this morning. Pt stated he called PCP triage nurse and was told to come to ED for possible appendicitis and CT scan.

## 2021-03-01 NOTE — Anesthesia Preprocedure Evaluation (Addendum)
Anesthesia Evaluation  Patient identified by MRN, date of birth, ID band Patient awake    Reviewed: Allergy & Precautions, H&P , NPO status , Patient's Chart, lab work & pertinent test results, reviewed documented beta blocker date and time   Airway Mallampati: II  TM Distance: >3 FB Neck ROM: full    Dental  (+) Teeth Intact   Pulmonary neg pulmonary ROS, former smoker,    Pulmonary exam normal        Cardiovascular Exercise Tolerance: Good hypertension, On Medications + CAD  negative cardio ROS Normal cardiovascular exam+ Valvular Problems/Murmurs  Rhythm:regular Rate:Normal  Recent cardiac stress test and notes reviewed. ja   Neuro/Psych negative neurological ROS  negative psych ROS   GI/Hepatic Neg liver ROS, PUD, GERD  Medicated,  Endo/Other  negative endocrine ROS  Renal/GU negative Renal ROS  negative genitourinary   Musculoskeletal   Abdominal   Peds  Hematology negative hematology ROS (+)   Anesthesia Other Findings Past Medical History: No date: Actinic keratosis No date: Colon polyp No date: Duodenal ulcer 2019: H/O colonoscopy No date: Heart disease No date: Heart murmur No date: High cholesterol No date: Hypertension Past Surgical History: No date: CARDIAC CATHETERIZATION 06/26/2019: COLONOSCOPY WITH PROPOFOL; N/A     Comment:  Procedure: COLONOSCOPY WITH PROPOFOL;  Surgeon: Toledo,               Benay Pike, MD;  Location: [facility] ENDOSCOPY;  Service:               Gastroenterology;  Laterality: N/A; 06/26/2019: ESOPHAGOGASTRODUODENOSCOPY (EGD) WITH PROPOFOL; N/A     Comment:  Procedure: ESOPHAGOGASTRODUODENOSCOPY (EGD) WITH               PROPOFOL;  Surgeon: Toledo, Benay Pike, MD;  Location:               [facility];  Service: Gastroenterology;  Laterality:               N/A; 09/28/2017: INTRAVASCULAR PRESSURE WIRE/FFR STUDY; N/A     Comment:  Procedure: INTRAVASCULAR PRESSURE WIRE/FFR STUDY;                 Surgeon: Isaias Cowman, MD;  Location: [place] [facility];  Service: Cardiovascular;  Laterality:               N/A; 09/28/2017: LEFT HEART CATH AND CORONARY ANGIOGRAPHY; Left     Comment:  Procedure: LEFT HEART CATH AND CORONARY ANGIOGRAPHY;                Surgeon: Isaias Cowman, MD;  Location: [place] [facility];  Service: Cardiovascular;  Laterality:               Left; No date: SHOULDER ARTHROSCOPY W/ ROTATOR CUFF REPAIR; Right BMI    Body Mass Index: 30.27 kg/m     Reproductive/Obstetrics negative OB ROS                            Anesthesia Physical Anesthesia Plan  ASA: 3 and emergent  Anesthesia Plan: General ETT   Post-op Pain Management:    Induction:   PONV Risk Score and Plan:   Airway Management Planned:   Additional Equipment:   Intra-op Plan:   Post-operative Plan:  Informed Consent: I have reviewed the patients History and Physical, chart, labs and discussed the procedure including the risks, benefits and alternatives for the proposed anesthesia with the patient or authorized representative who has indicated his/her understanding and acceptance.     Dental Advisory Given  Plan Discussed with: CRNA  Anesthesia Plan Comments:         Anesthesia Quick Evaluation

## 2021-03-01 NOTE — Telephone Encounter (Signed)
Pt c/o abdominal pain from right side of abdomen to right of belly button but down 2-3 inches. Pt stated hat 0100 that he had a sever like a shock pain that radiated to his scrotum. Pt stated it felt like he had kicked in the testicles. Pain was sever. Now pain since has been moderate. Abdomen is distended and painful to touch.  Only some relief when pt is left side lying. No further pain to testicles. No fever or vomiting. Last BM was this am. Pt has previously noted blood on his stool. Pt attributes to hemorrhoids. Care advice given and instructed pt to be NPO. Wife is driving him. Pt verbalized understanding.        Reason for Disposition  Blood in bowel movements  (Exception: Blood on surface of BM with constipation)  Answer Assessment - Initial Assessment Questions 1. LOCATION: "Where does it hurt?"      Right side to right belly down 2-3 inches 2. RADIATION: "Does the pain shoot anywhere else?" (e.g., chest, back)     no 3. ONSET: "When did the pain begin?" (Minutes, hours or days ago)      0100 woke pt up  felt like a shock that refrred to testicles- Pt stated it felt like a kick to the groin.  4. SUDDEN: "Gradual or sudden onset?"     Sudden wen to scrotum electrical sensation. c 5. PATTERN "Does the pain come and go, or is it constant?"    - If constant: "Is it getting better, staying the same, or worsening?"      (Note: Constant means the pain never goes away completely; most serious pain is constant and it progresses)     - If intermittent: "How long does it last?" "Do you have pain now?"     (Note: Intermittent means the pain goes away completely between bouts)     Sore to touch and intermittent pain 6. SEVERITY: "How bad is the pain?"  (e.g., Scale 1-10; mild, moderate, or severe)    - MILD (1-3): doesn't interfere with normal activities, abdomen soft and not tender to touch     - MODERATE (4-7): interferes with normal activities or awakens from sleep, abdomen tender to  touch     - SEVERE (8-10): excruciating pain, doubled over, unable to do any normal activities       Moderate "pressure" tight and distended- painful to touch 7. RECURRENT SYMPTOM: "Have you ever had this type of stomach pain before?" If Yes, ask: "When was the last time?" and "What happened that time?"      Yes yeas ago abx 8. CAUSE: "What do you think is causing the stomach pain?"     appendicitis 9. RELIEVING/AGGRAVATING FACTORS: "What makes it better or worse?" (e.g., movement, antacids, bowel movement)     Left side helps 10. OTHER SYMPTOMS: "Do you have any other symptoms?" (e.g., back pain, diarrhea, fever, urination pain, vomiting)       Burping last BM this am  Protocols used: Abdominal Pain - Male-A-AH

## 2021-03-02 ENCOUNTER — Encounter: Payer: Self-pay | Admitting: Family Medicine

## 2021-03-02 ENCOUNTER — Encounter: Payer: Self-pay | Admitting: General Surgery

## 2021-03-02 MED ORDER — HYDROCODONE-ACETAMINOPHEN 5-325 MG PO TABS: 1.0000 | ORAL_TABLET | ORAL | 0 refills | Status: AC | PRN

## 2021-03-02 MED ORDER — ONDANSETRON HCL 4 MG/2ML IJ SOLN: 4.0000 mg | Freq: Once | INTRAMUSCULAR | Status: DC | PRN

## 2021-03-02 MED ORDER — BUPIVACAINE-EPINEPHRINE (PF) 0.5% -1:200000 IJ SOLN
INTRAMUSCULAR | Status: DC | PRN
  Administered 2021-03-01: 30 mL via PERINEURAL

## 2021-03-02 MED ORDER — HYDROCODONE-ACETAMINOPHEN 5-325 MG PO TABS
ORAL_TABLET | ORAL | Status: AC
  Filled 2021-03-02: qty 2

## 2021-03-02 MED ORDER — LACTATED RINGERS IV SOLN: INTRAVENOUS | Status: DC | PRN

## 2021-03-02 MED ORDER — FENTANYL CITRATE (PF) 100 MCG/2ML IJ SOLN
INTRAMUSCULAR | Status: AC
  Administered 2021-03-02: 25 ug via INTRAVENOUS
  Filled 2021-03-02: qty 2

## 2021-03-02 MED ORDER — FENTANYL CITRATE (PF) 100 MCG/2ML IJ SOLN
25.0000 ug | INTRAMUSCULAR | Status: DC | PRN
  Administered 2021-03-02: 25 ug via INTRAVENOUS

## 2021-03-02 MED ORDER — SUGAMMADEX SODIUM 500 MG/5ML IV SOLN
INTRAVENOUS | Status: DC | PRN
  Administered 2021-03-02: 200 mg via INTRAVENOUS

## 2021-03-02 MED ORDER — ONDANSETRON HCL 4 MG/2ML IJ SOLN
INTRAMUSCULAR | Status: DC | PRN
  Administered 2021-03-02: 4 mg via INTRAVENOUS

## 2021-03-02 MED ORDER — DEXMEDETOMIDINE (PRECEDEX) IN NS 20 MCG/5ML (4 MCG/ML) IV SYRINGE
PREFILLED_SYRINGE | INTRAVENOUS | Status: DC | PRN
  Administered 2021-03-01 – 2021-03-02 (×5): 4 ug via INTRAVENOUS

## 2021-03-02 NOTE — Discharge Instructions (Signed)

## 2021-03-02 NOTE — Plan of Care (Signed)
Patient discharged to home with family.  Reviewed discharge instructions, medication changes and appointment reminders with patient and wife.  No needs verbalized.  Transported to main entrance to home.

## 2021-03-02 NOTE — Op Note (Signed)
Pre-op Diagnosis: Acute appendicitis   Post op Diagnosis: Acute appenditicis  Procedure: Robotic assisted laparoscopic appendectomy.  Anesthesia: GETA  Surgeon: Herbert Pun, MD, FACS  Wound Classification: clean contaminated  Specimen: Appendix  Complications: None  Estimated Blood Loss: 3 mL   Indications: Patient is a 60 y.o. male  presented with above right lower quadrant pain. CT scan shows acute appendicitis.     FIndings: 1.  Irritated appendix with appendicolith   2. No peri-appendiceal abscess or phlegmon 3. Normal anatomy 4. Adequate hemostasis.   Description of procedure: The patient was placed on the operating table in the supine position. General anesthesia was induced. A time-out was completed verifying correct patient, procedure, site, positioning, and implant(s) and/or special equipment prior to beginning this procedure. The abdomen was prepped and draped in the usual sterile fashion.   Palmer's point located and Veress needle was inserted.  After confirming 2 clicks and a positive saline drop test, gas insufflation was initiated until the abdominal pressure was measured at 15 mmHg.  Afterwards, the Veress needle was removed and a 8 mm port was placed in left upper quadrant area using Optiview technique.  After local was infused, 3 additional incision on the left abdominal wall were made 5 cm apart.  An 12 mm port and two other 8 mm ports were placed under direct visualization.  No injuries from trocar placements were noted.  The table was placed in the Trendelenburg position with the right side elevated.  With the use of Tip up grasper, Force Bipolar and scissors, an inflamed appendix was identified and elevated.  Window created at base of appendix in the mesentery.    Mesentery was divided with combination of bipolar and cautery energy. The base of the appendix was ligated 3-0 Vloc purse string. The appendix was divided with scissors. Second layer closure of  the appendix orifice was done with same 3-0 Vloc.   The appendiceal stump was examined and hemostasis noted. No other pathology was identified within pelvis. The 12 mm trocar removed and port site closed with PMI using 0 vicryl under direct vision. Remaining trocars were removed under direct vision. No bleeding was noted.The abdomen was allowed to collapse.  All skin incisions then closed with subcuticular sutures Monocryl 4-0.  Wounds then dressed with dermabond.  The patient tolerated the procedure well, awakened from anesthesia and was taken to the postanesthesia care unit in satisfactory condition.  Sponge count and instrument count correct at the end of the procedure.

## 2021-03-02 NOTE — Transfer of Care (Signed)
Immediate Anesthesia Transfer of Care Note  Patient: Kevin Crawford  Procedure(s) Performed: XI ROBOTIC LAPAROSCOPIC ASSISTED APPENDECTOMY  Patient Location: PACU  Anesthesia Type:General  Level of Consciousness: sedated and patient cooperative  Airway & Oxygen Therapy: Patient Spontanous Breathing and Patient connected to face mask oxygen  Post-op Assessment: Report given to RN and Post -op Vital signs reviewed and stable  Post vital signs: Reviewed and stable  Last Vitals:  Vitals Value Taken Time  BP 106/63 03/02/21 0045  Temp 36.5 C 03/02/21 0041  Pulse 66 03/02/21 0047  Resp 15 03/02/21 0047  SpO2 98 % 03/02/21 0047  Vitals shown include unvalidated device data.  Last Pain:  Vitals:   03/02/21 0041  TempSrc:   PainSc: Asleep         Complications: No notable events documented.

## 2021-03-02 NOTE — Clinical Social Work Note (Signed)
Patient has orders to discharge home today. Chart reviewed. PCP is Lelon Huh, MD. On room air. Has incision on abdomen (dermabond). No TOC needs identified. CSW signing off.  Dayton Scrape, [place]

## 2021-03-02 NOTE — Plan of Care (Signed)

## 2021-03-02 NOTE — Discharge Summary (Signed)
  Patient ID: ALLIJAH MARIS MRN: [id number] DOB/AGE: [birth year] 58 y.o.  Admit date: 03/01/2021 Discharge date: 03/02/2021   Discharge Diagnoses:  Active Problems:   Acute appendicitis with localized peritonitis   Procedures:Robotic assisted laparoscopic cholecystectomy  Hospital Course: Patient came with acute appendicitis. He underwent robotic assisted laparoscopic appendectomy. Tolerated procedure well. Pain controlled. Ambulating. Tolerating diet. Pain dry and clean.   Physical Exam Cardiovascular:     Rate and Rhythm: Normal rate and regular rhythm.  Pulmonary:     Effort: Pulmonary effort is normal.  Abdominal:     General: Abdomen is flat. Bowel sounds are normal.     Palpations: Abdomen is soft.  Skin:    General: Skin is warm.     Capillary Refill: Capillary refill takes less than 2 seconds.  Neurological:     Mental Status: He is alert and oriented to person, place, and time.     Consults: None  Disposition: Discharge disposition: 01-Home or Self Care       Discharge Instructions     Diet - low sodium heart healthy   Complete by: As directed    Increase activity slowly   Complete by: As directed       Allergies as of 03/02/2021       Reactions   Phenergan [promethazine Hcl] Other (See Comments)   SEIZURES   Mobic [meloxicam] Other (See Comments)   Severe cramping        Medication List     TAKE these medications    amLODipine-benazepril 5-20 MG capsule Commonly known as: LOTREL Take 1 capsule by mouth daily.   aspirin 81 MG EC tablet Take 81 mg by mouth daily. Swallow whole.   atorvastatin 10 MG tablet Commonly known as: LIPITOR Take 1 tablet (10 mg total) by mouth daily.   Fish Oil 1000 MG Caps Take 1,500 mg by mouth daily.   HYDROcodone-acetaminophen 5-325 MG tablet Commonly known as: Norco Take 1 tablet by mouth every 4 (four) hours as needed for up to 3 days for moderate pain.   ketoconazole 2 % shampoo Commonly known  as: NIZORAL Apply to affected areas, leave in for 10 mins then rinsefor up to 3 times a week.   multivitamin tablet Take 1 tablet by mouth daily.   OVER THE COUNTER MEDICATION Apply 1 application topically daily as needed (for pain). CBD Lotion   tadalafil 10 MG tablet Commonly known as: CIALIS Take 1 tablet (10 mg total) by mouth daily as needed. What changed: additional instructions   valACYclovir 1000 MG tablet Commonly known as: VALTREX Take 2 tabs po at onset of outbreak then 2 more tabs 12h later.        Follow-up Information     Herbert Pun, MD Follow up in 2 week(s).   Specialty: General Surgery Why: Follow up after appendectomy Contact information: [place] [address] [phone]

## 2021-03-02 NOTE — Progress Notes (Signed)
Patient's wife is at the bedside and cannot leave due to not having reliable family transportation. Patient was offered other transport services such as [place] or local taxis services, however the patient declined. He states to this nurse, "why would you make her leave this late? My wife will not leave the hospital at 2am with a "stranger" during these time of hours". Charge nurse is notified of the situation. Patient's wife remained at bedside. Patient is expected to discharge today

## 2021-03-03 ENCOUNTER — Encounter: Payer: Self-pay | Admitting: Dermatology

## 2021-03-03 LAB — SURGICAL PATHOLOGY

## 2021-03-03 NOTE — Anesthesia Postprocedure Evaluation (Signed)
Anesthesia Post Note  Patient: LANEY TUFO  Procedure(s) Performed: XI ROBOTIC LAPAROSCOPIC ASSISTED APPENDECTOMY  Patient location during evaluation: PACU Anesthesia Type: General Level of consciousness: awake and alert Pain management: pain level controlled Vital Signs Assessment: post-procedure vital signs reviewed and stable Respiratory status: spontaneous breathing, nonlabored ventilation, respiratory function stable and patient connected to nasal cannula oxygen Cardiovascular status: blood pressure returned to baseline and stable Postop Assessment: no apparent nausea or vomiting Anesthetic complications: no   No notable events documented.   Last Vitals:  Vitals:   03/02/21 0418 03/02/21 0756  BP: 118/71 115/75  Pulse: 79 67  Resp: 20   Temp: 36.7 C 36.4 C  SpO2: 93% 93%    Last Pain:  Vitals:   03/02/21 0920  TempSrc:   PainSc: Asleep                 Molli Barrows

## 2021-03-24 ENCOUNTER — Encounter: Payer: 59 | Admitting: Dermatology

## 2021-03-30 ENCOUNTER — Ambulatory Visit: Payer: 59 | Admitting: Dermatology

## 2021-03-30 ENCOUNTER — Other Ambulatory Visit: Payer: Self-pay

## 2021-03-30 DIAGNOSIS — L578 Other skin changes due to chronic exposure to nonionizing radiation: Secondary | ICD-10-CM

## 2021-03-30 DIAGNOSIS — D229 Melanocytic nevi, unspecified: Secondary | ICD-10-CM

## 2021-03-30 DIAGNOSIS — L821 Other seborrheic keratosis: Secondary | ICD-10-CM

## 2021-03-30 DIAGNOSIS — Z1283 Encounter for screening for malignant neoplasm of skin: Secondary | ICD-10-CM

## 2021-03-30 DIAGNOSIS — Z872 Personal history of diseases of the skin and subcutaneous tissue: Secondary | ICD-10-CM

## 2021-03-30 DIAGNOSIS — D1800 Hemangioma unspecified site: Secondary | ICD-10-CM

## 2021-03-30 DIAGNOSIS — L814 Other melanin hyperpigmentation: Secondary | ICD-10-CM

## 2021-03-30 NOTE — Patient Instructions (Signed)
Melanoma ABCDEs  Melanoma is the most dangerous type of skin cancer, and is the leading cause of death from skin disease.  You are more likely to develop melanoma if you: Have light-colored skin, light-colored eyes, or red or blond hair Spend a lot of time in the sun Tan regularly, either outdoors or in a tanning bed Have had blistering sunburns, especially during childhood Have a close family member who has had a melanoma Have atypical moles or large birthmarks  Early detection of melanoma is key since treatment is typically straightforward and cure rates are extremely high if we catch it early.   The first sign of melanoma is often a change in a mole or a new dark spot.  The ABCDE system is a way of remembering the signs of melanoma.  A for asymmetry:  The two halves do not match. B for border:  The edges of the growth are irregular. C for color:  A mixture of colors are present instead of an even brown color. D for diameter:  Melanomas are usually (but not always) greater than 39mm - the size of a pencil eraser. E for evolution:  The spot keeps changing in size, shape, and color.  Please check your skin once per month between visits. You can use a small mirror in front and a large mirror behind you to keep an eye on the back side or your body.   If you see any new or changing lesions before your next follow-up, please call to schedule a visit.  Please continue daily skin protection including broad spectrum sunscreen SPF 30+ to sun-exposed areas, reapplying every 2 hours as needed when you're outdoors.    Recommend taking Heliocare sun protection supplement daily in sunny weather for additional sun protection. For maximum protection on the sunniest days, you can take up to 2 capsules of regular Heliocare OR take 1 capsule of Heliocare Ultra. For prolonged exposure (such as a full day in the sun), you can repeat your dose of the supplement 4 hours after your first dose. Heliocare can be  purchased at [facility] or at [email or web address].    If you have any questions or concerns for your doctor, please call our main line at [phone] and press option 4 to reach your doctor's medical assistant. If no one answers, please leave a voicemail as directed and we will return your call as soon as possible. Messages left after 4 pm will be answered the following business day.   You may also send [place] a message via [place]. We typically respond to MyChart messages within 1-2 business days.  For prescription refills, please ask your pharmacy to contact our office. Our fax number is [phone].  If you have an urgent issue when the clinic is closed that cannot wait until the next business day, you can page your doctor at the number below.    Please note that while we do our best to be available for urgent issues outside of office hours, we are not available 24/7.   If you have an urgent issue and are unable to reach [place], you may choose to seek medical care at your doctor's office, retail clinic, urgent care center, or emergency room.  If you have a medical emergency, please immediately call 911 or go to the emergency department.  Pager Numbers  - Dr. Nehemiah Massed: [phone]  - Dr. Laurence Ferrari: [phone]  - Dr. Nicole Kindred: [phone]  In the event of inclement weather, please call our main line  at [phone] for an update on the status of any delays or closures.  Dermatology Medication Tips: Please keep the boxes that topical medications come in in order to help keep track of the instructions about where and how to use these. Pharmacies typically print the medication instructions only on the boxes and not directly on the medication tubes.   If your medication is too expensive, please contact our office at [phone] option 4 or send [place] a message through [place].   We are unable to tell what your co-pay for medications will be in advance as this is different depending on your  insurance coverage. However, we may be able to find a substitute medication at lower cost or fill out paperwork to get insurance to cover a needed medication.   If a prior authorization is required to get your medication covered by your insurance company, please allow [place] 1-2 business days to complete this process.  Drug prices often vary depending on where the prescription is filled and some pharmacies may offer cheaper prices.  The website www.goodrx.com contains coupons for medications through different pharmacies. The prices here do not account for what the cost may be with help from insurance (it may be cheaper with your insurance), but the website can give you the price if you did not use any insurance.  - You can print the associated coupon and take it with your prescription to the pharmacy.  - You may also stop by our office during regular business hours and pick up a GoodRx coupon card.  - If you need your prescription sent electronically to a different pharmacy, notify our office through [facility] or by phone at [phone] option 4.

## 2021-03-30 NOTE — Progress Notes (Signed)
   Follow-Up Visit   Subjective  Kevin Crawford is a 60 y.o. male who presents for the following: Follow-up (Patient here today for AK follow up.) and FBSE (Patient here for full body skin exam and skin cancer screening. Patient is not aware of any new or changing spots. Patient with no hx of skin cancer. /).  Patient accompanied by wife.  The following portions of the chart were reviewed this encounter and updated as appropriate:   Tobacco  Allergies  Meds  Problems  Med Hx  Surg Hx  Fam Hx      Review of Systems:  No other skin or systemic complaints except as noted in HPI or Assessment and Plan.  Objective  Well appearing patient in no apparent distress; mood and affect are within normal limits.  A full examination was performed including scalp, head, eyes, ears, nose, lips, neck, chest, axillae, abdomen, back, buttocks, bilateral upper extremities, bilateral lower extremities, hands, feet, fingers, toes, fingernails, and toenails. All findings within normal limits unless otherwise noted below.    Assessment & Plan   Lentigines - Scattered tan macules - Due to sun exposure - Benign-appearing, observe - Recommend daily broad spectrum sunscreen SPF 30+ to sun-exposed areas, reapply every 2 hours as needed. - Call for any changes  Seborrheic Keratoses - Stuck-on, waxy, tan-brown papules and/or plaques  - Benign-appearing - Discussed benign etiology and prognosis. - Observe - Call for any changes  Melanocytic Nevi - Tan-brown and/or pink-flesh-colored symmetric macules and papules - Benign appearing on exam today - Observation - Call clinic for new or changing moles - Recommend daily use of broad spectrum spf 30+ sunscreen to sun-exposed areas.   Hemangiomas - Red papules - Discussed benign nature - Observe - Call for any changes  Actinic Damage - Chronic condition, secondary to cumulative UV/sun exposure - diffuse scaly erythematous macules with  underlying dyspigmentation - Recommend daily broad spectrum sunscreen SPF 30+ to sun-exposed areas, reapply every 2 hours as needed.  - Staying in the shade or wearing long sleeves, sun glasses (UVA+UVB protection) and wide brim hats (4-inch brim around the entire circumference of the hat) are also recommended for sun protection.  - Call for new or changing lesions.  Skin cancer screening performed today.  History of PreCancerous Actinic Keratosis  - site(s) of PreCancerous Actinic Keratosis clear today. - these may recur and new lesions may form requiring treatment to prevent transformation into skin cancer - observe for new or changing spots and contact [place] for appointment if occur - photoprotection with sun protective clothing; sunglasses and broad spectrum sunscreen with SPF of at least 30 + and frequent self skin exams recommended - yearly exams by a dermatologist recommended for persons with history of PreCancerous Actinic Keratoses  Return in about 1 year (around 03/30/2022) for TBSE.  Graciella Belton, RMA, am acting as scribe for Forest Gleason, MD .  Documentation: I have reviewed the above documentation for accuracy and completeness, and I agree with the above.  Forest Gleason, MD

## 2021-03-31 ENCOUNTER — Encounter: Payer: Self-pay | Admitting: Dermatology

## 2021-05-31 ENCOUNTER — Encounter: Payer: Self-pay | Admitting: Family Medicine

## 2021-05-31 ENCOUNTER — Other Ambulatory Visit: Payer: Self-pay

## 2021-05-31 ENCOUNTER — Ambulatory Visit (INDEPENDENT_AMBULATORY_CARE_PROVIDER_SITE_OTHER): Payer: 59 | Admitting: Family Medicine

## 2021-05-31 VITALS — BP 125/79 | HR 65 | Temp 98.4°F | Resp 16 | Ht 69.0 in | Wt 205.0 lb

## 2021-05-31 DIAGNOSIS — R1011 Right upper quadrant pain: Secondary | ICD-10-CM | POA: Insufficient documentation

## 2021-05-31 DIAGNOSIS — R7303 Prediabetes: Secondary | ICD-10-CM | POA: Diagnosis not present

## 2021-05-31 DIAGNOSIS — Z114 Encounter for screening for human immunodeficiency virus [HIV]: Secondary | ICD-10-CM | POA: Diagnosis not present

## 2021-05-31 NOTE — Patient Instructions (Signed)
It was great to see you!  Our plans for today:  - Call to schedule your ultrasound. - We are checking some labs today, we will release these results to your MyChart.  Take care and seek immediate care sooner if you develop any concerns.   Dr. Ky Barban

## 2021-05-31 NOTE — Assessment & Plan Note (Signed)
Chronic. Likely 2/2 hepatic steatosis seen on CT A/P 02/2021, no gallstones or wall thickening at that time.. Will obtain RUQ [place] to characterize further. No current signs/symptoms to warrant emergent imaging. Will obtain labs.

## 2021-05-31 NOTE — Progress Notes (Signed)
   SUBJECTIVE:   CHIEF COMPLAINT / HPI:   ABDOMINAL ISSUES - previously seen 11/2020 for RUQ abd pain. Unable to get RUQ [place]. Labs notable for slightly elevated ALT. GGT, PT/PTT normal. CT A/P 02/2021 with fatty liver.  - 12/19 [place] - some alcohol, at most 4 drinks per night about 4 nights per week. Scotch, beer. Never had withdrawal.  Duration:  years Nature: pressure, stabbing Location: RUQ Radiation: sometimes to side and back  Frequency: constant but worse in evenings Alleviating factors: not drinking Aggravating factors: none Treatments attempted: occasional tylenol  Constipation: intermittent Diarrhea: no Nausea: no Vomiting: no Melena or hematochezia:  occasional, attributes to internal hemorrhoids.  Rash: no Jaundice: no Fever: no Weight loss: no   OBJECTIVE:   BP 125/79 (BP Location: Left Arm, Patient Position: Sitting, Cuff Size: Large)   Pulse 65   Temp 98.4 F (36.9 C) (Temporal)   Resp 16   Ht 5\' 9"  (1.753 m)   Wt 205 lb (93 kg)   SpO2 99%   BMI 30.27 kg/m   Gen: well appearing, in NAD Card: RRR Lungs: CTAB Abd: soft, slightly tender over RUQ. +BS. No organomegaly. Negative Murphy sign.  Ext: WWP   ASSESSMENT/PLAN:   RUQ pain Chronic. Likely 2/2 hepatic steatosis seen on CT A/P 02/2021, no gallstones or wall thickening at that time.. Will obtain RUQ [place] to characterize further. No current signs/symptoms to warrant emergent imaging. Will obtain labs.      Myles Gip, DO

## 2021-06-01 LAB — COMPREHENSIVE METABOLIC PANEL
ALT: 41 IU/L (ref 0–44)
AST: 28 IU/L (ref 0–40)
Albumin/Globulin Ratio: 2.1 (ref 1.2–2.2)
Albumin: 4.9 g/dL (ref 3.8–4.9)
Alkaline Phosphatase: 66 IU/L (ref 44–121)
BUN/Creatinine Ratio: 15 (ref 9–20)
BUN: 13 mg/dL (ref 6–24)
Bilirubin Total: 0.6 mg/dL (ref 0.0–1.2)
CO2: 25 mmol/L (ref 20–29)
Calcium: 9.5 mg/dL (ref 8.7–10.2)
Chloride: 104 mmol/L (ref 96–106)
Creatinine, Ser: 0.87 mg/dL (ref 0.76–1.27)
Globulin, Total: 2.3 g/dL (ref 1.5–4.5)
Glucose: 107 mg/dL — ABNORMAL HIGH (ref 70–99)
Potassium: 4.5 mmol/L (ref 3.5–5.2)
Sodium: 142 mmol/L (ref 134–144)
Total Protein: 7.2 g/dL (ref 6.0–8.5)
eGFR: 100 mL/min/{1.73_m2} (ref >59)

## 2021-06-01 LAB — CBC WITH DIFFERENTIAL/PLATELET
Basophils Absolute: 0.1 10*3/uL (ref 0.0–0.2)
Basos: 1 %
EOS (ABSOLUTE): 0.2 10*3/uL (ref 0.0–0.4)
Eos: 5 %
Hematocrit: 44.6 % (ref 37.5–51.0)
Hemoglobin: 15.1 g/dL (ref 13.0–17.7)
Immature Grans (Abs): 0 10*3/uL (ref 0.0–0.1)
Immature Granulocytes: 0 %
Lymphocytes Absolute: 1.4 10*3/uL (ref 0.7–3.1)
Lymphs: 30 %
MCH: 32.8 pg (ref 26.6–33.0)
MCHC: 33.9 g/dL (ref 31.5–35.7)
MCV: 97 fL (ref 79–97)
Monocytes Absolute: 0.4 10*3/uL (ref 0.1–0.9)
Monocytes: 8 %
Neutrophils Absolute: 2.6 10*3/uL (ref 1.4–7.0)
Neutrophils: 56 %
Platelets: 216 10*3/uL (ref 150–450)
RBC: 4.6 x10E6/uL (ref 4.14–5.80)
RDW: 12.2 % (ref 11.6–15.4)
WBC: 4.6 10*3/uL (ref 3.4–10.8)

## 2021-06-01 LAB — LIPID PANEL
Chol/HDL Ratio: 2.6 ratio (ref 0.0–5.0)
Cholesterol, Total: 144 mg/dL (ref 100–199)
HDL: 56 mg/dL (ref >39)
LDL Chol Calc (NIH): 68 mg/dL (ref 0–99)
Triglycerides: 112 mg/dL (ref 0–149)
VLDL Cholesterol Cal: 20 mg/dL (ref 5–40)

## 2021-06-01 LAB — ACUTE VIRAL HEPATITIS (HAV, HBV, HCV)
HCV Ab: <0.1 s/co ratio (ref 0.0–0.9)
Hep A IgM: NEGATIVE
Hep B C IgM: NEGATIVE
Hepatitis B Surface Ag: NEGATIVE

## 2021-06-01 LAB — HEMOGLOBIN A1C
Est. average glucose Bld gHb Est-mCnc: 103 mg/dL
Hgb A1c MFr Bld: 5.2 % (ref 4.8–5.6)

## 2021-06-01 LAB — PROTIME-INR
INR: 1 (ref 0.9–1.2)
Prothrombin Time: 10.4 s (ref 9.1–12.0)

## 2021-06-01 LAB — AMYLASE: Amylase: 51 U/L (ref 31–110)

## 2021-06-01 LAB — HCV INTERPRETATION

## 2021-06-01 LAB — HEPATITIS C ANTIBODY: Hep C Virus Ab: <0.1 s/co ratio (ref 0.0–0.9)

## 2021-06-01 LAB — HIV ANTIBODY (ROUTINE TESTING W REFLEX): HIV Screen 4th Generation wRfx: NONREACTIVE

## 2021-06-01 LAB — LIPASE: Lipase: 30 U/L (ref 13–78)

## 2021-06-02 ENCOUNTER — Ambulatory Visit
Admission: RE | Admit: 2021-06-02 | Discharge: 2021-06-02 | Disposition: A | Discharge status: Home / Self Care | Payer: 59 | Source: Ambulatory Visit | Attending: Family Medicine | Admitting: Family Medicine

## 2021-06-02 DIAGNOSIS — R1011 Right upper quadrant pain: Secondary | ICD-10-CM | POA: Insufficient documentation

## 2021-06-03 ENCOUNTER — Encounter: Payer: Self-pay | Admitting: Family Medicine

## 2021-06-04 ENCOUNTER — Encounter: Payer: Self-pay | Admitting: Family Medicine

## 2021-06-16 ENCOUNTER — Ambulatory Visit: Payer: 59 | Admitting: Family Medicine

## 2021-07-07 ENCOUNTER — Encounter: Payer: Self-pay | Admitting: Family Medicine

## 2021-07-07 DIAGNOSIS — I10 Essential (primary) hypertension: Secondary | ICD-10-CM

## 2021-07-09 ENCOUNTER — Encounter: Payer: Self-pay | Admitting: Family Medicine

## 2021-07-09 MED ORDER — AMLODIPINE BESY-BENAZEPRIL HCL 5-20 MG PO CAPS: 1.0000 | ORAL_CAPSULE | Freq: Every day | ORAL | 1 refills | Status: DC

## 2021-07-09 NOTE — Telephone Encounter (Signed)
Duplicate message... See previous My Chart Message   Thanks,   -Mickel Baas

## 2021-08-04 DIAGNOSIS — I2510 Atherosclerotic heart disease of native coronary artery without angina pectoris: Secondary | ICD-10-CM | POA: Diagnosis not present

## 2021-08-04 DIAGNOSIS — Z9889 Other specified postprocedural states: Secondary | ICD-10-CM | POA: Diagnosis not present

## 2021-08-04 DIAGNOSIS — I10 Essential (primary) hypertension: Secondary | ICD-10-CM | POA: Diagnosis not present

## 2021-08-04 DIAGNOSIS — R079 Chest pain, unspecified: Secondary | ICD-10-CM | POA: Diagnosis not present

## 2021-09-28 DIAGNOSIS — R0789 Other chest pain: Secondary | ICD-10-CM | POA: Diagnosis not present

## 2021-10-04 DIAGNOSIS — R0789 Other chest pain: Secondary | ICD-10-CM | POA: Diagnosis not present

## 2021-10-04 DIAGNOSIS — K219 Gastro-esophageal reflux disease without esophagitis: Secondary | ICD-10-CM | POA: Diagnosis not present

## 2021-11-05 ENCOUNTER — Encounter: Payer: Self-pay | Admitting: Family Medicine

## 2021-11-05 ENCOUNTER — Other Ambulatory Visit: Payer: Self-pay | Admitting: Family Medicine

## 2021-11-05 DIAGNOSIS — E785 Hyperlipidemia, unspecified: Secondary | ICD-10-CM

## 2021-11-05 NOTE — Telephone Encounter (Signed)
Requested Prescriptions  Pending Prescriptions Disp Refills  . atorvastatin (LIPITOR) 10 MG tablet [Pharmacy Med Name: Atorvastatin Calcium 10 MG Oral Tablet] 90 tablet 2    Sig: Take 1 tablet by mouth once daily     Cardiovascular:  Antilipid - Statins Failed - 11/05/2021  3:00 PM      Failed - Lipid Panel in normal range within the last 12 months    Cholesterol, Total  Date Value Ref Range Status  05/31/2021 144 100 - 199 mg/dL Final   LDL Chol Calc (NIH)  Date Value Ref Range Status  05/31/2021 68 0 - 99 mg/dL Final   LDL Direct  Date Value Ref Range Status  04/21/2020 77 0 - 99 mg/dL Final   HDL  Date Value Ref Range Status  05/31/2021 56 >39 mg/dL Final   Triglycerides  Date Value Ref Range Status  05/31/2021 112 0 - 149 mg/dL Final         Passed - Patient is not pregnant      Passed - Valid encounter within last 12 months    Recent Outpatient Visits          5 months ago RUQ pain   [place], DO   11 months ago RUQ pain   [facility] Birdie Sons, MD   1 year ago Coronary artery disease involving native coronary artery of native heart without angina pectoris   [place], Vickki Muff, PA-C   1 year ago Essential hypertension   [facility] Birdie Sons, MD   2 years ago Pain in right thigh   [place], Kirstie Peri, MD      Future Appointments            In 5 months [facility], [place], MD [place]

## 2021-12-13 ENCOUNTER — Telehealth: Payer: Self-pay

## 2021-12-13 NOTE — Telephone Encounter (Signed)
Copied from CRM [id number]. Topic: General - Call Back - No Documentation >> Dec 13, 2021 10:45 AM Franchot Heidelberg wrote: Reason for CRM: Pt called requesting orders for labs to have prior to having his lab work for cardiology, says he needs to have this week. He needs whatever was ordered in December, he says this is an executive panel that he needs for cardiology.  Best contact: [phone]

## 2021-12-13 NOTE — Telephone Encounter (Signed)
I called and spoke with patient. He is requesting to have the same labs repeated from when he was last seen in the office by Dr. Linwood Dibbles on 05/31/2021. Patient says that Dr. Linwood Dibbles told him that we should check those labs once or twice a year. Patient has an appointment to see cardiology on 01/25/2022. He wants blood work done next week here in our office. I advised patient that  Dr. Sherrie Mustache is out of the office this week. Patient is requesting that Dr. Linwood Dibbles reorder labs since she saw him in December and gave the recommendation to repeat labs twice a year. Please advise.

## 2021-12-14 ENCOUNTER — Encounter: Payer: Self-pay | Admitting: Family Medicine

## 2021-12-16 NOTE — Telephone Encounter (Signed)
Pt advised but did not wish to make appt at this time.

## 2021-12-30 ENCOUNTER — Encounter: Admission: RE | Payer: Self-pay | Source: Ambulatory Visit

## 2021-12-30 ENCOUNTER — Ambulatory Visit: Admit: 2021-12-30 | Payer: Self-pay | Admitting: Gastroenterology

## 2021-12-30 ENCOUNTER — Ambulatory Visit: Admission: RE | Admit: 2021-12-30 | Payer: Self-pay | Source: Ambulatory Visit | Admitting: Gastroenterology

## 2021-12-30 SURGERY — EGD (ESOPHAGOGASTRODUODENOSCOPY)
Anesthesia: General

## 2022-01-06 ENCOUNTER — Encounter: Payer: Self-pay | Admitting: Family Medicine

## 2022-01-10 ENCOUNTER — Other Ambulatory Visit: Payer: Self-pay | Admitting: Physician Assistant

## 2022-01-10 DIAGNOSIS — N529 Male erectile dysfunction, unspecified: Secondary | ICD-10-CM

## 2022-01-10 MED ORDER — TADALAFIL 10 MG PO TABS: 10.0000 mg | ORAL_TABLET | Freq: Every day | ORAL | 0 refills | Status: AC | PRN

## 2022-01-26 ENCOUNTER — Ambulatory Visit: Payer: 59 | Admitting: Dermatology

## 2022-01-26 DIAGNOSIS — L578 Other skin changes due to chronic exposure to nonionizing radiation: Secondary | ICD-10-CM | POA: Diagnosis not present

## 2022-01-26 DIAGNOSIS — Z8619 Personal history of other infectious and parasitic diseases: Secondary | ICD-10-CM

## 2022-01-26 DIAGNOSIS — L814 Other melanin hyperpigmentation: Secondary | ICD-10-CM

## 2022-01-26 DIAGNOSIS — L820 Inflamed seborrheic keratosis: Secondary | ICD-10-CM

## 2022-01-26 DIAGNOSIS — L219 Seborrheic dermatitis, unspecified: Secondary | ICD-10-CM

## 2022-01-26 DIAGNOSIS — L570 Actinic keratosis: Secondary | ICD-10-CM

## 2022-01-26 DIAGNOSIS — Z1283 Encounter for screening for malignant neoplasm of skin: Secondary | ICD-10-CM | POA: Diagnosis not present

## 2022-01-26 DIAGNOSIS — L821 Other seborrheic keratosis: Secondary | ICD-10-CM | POA: Diagnosis not present

## 2022-01-26 DIAGNOSIS — D1800 Hemangioma unspecified site: Secondary | ICD-10-CM | POA: Diagnosis not present

## 2022-01-26 DIAGNOSIS — D229 Melanocytic nevi, unspecified: Secondary | ICD-10-CM

## 2022-01-26 MED ORDER — KETOCONAZOLE 2 % EX SHAM: MEDICATED_SHAMPOO | CUTANEOUS | 11 refills | Status: DC

## 2022-01-26 NOTE — Patient Instructions (Addendum)
Start Elidel cream apply to face qd for scale at face  Continue ketoconazole cream qd with face Continue ketoconazole shampoo    Melanoma ABCDEs  Melanoma is the most dangerous type of skin cancer, and is the leading cause of death from skin disease.  You are more likely to develop melanoma if you: Have light-colored skin, light-colored eyes, or red or blond hair Spend a lot of time in the sun Tan regularly, either outdoors or in a tanning bed Have had blistering sunburns, especially during childhood Have a close family member who has had a melanoma Have atypical moles or large birthmarks  Early detection of melanoma is key since treatment is typically straightforward and cure rates are extremely high if we catch it early.   The first sign of melanoma is often a change in a mole or a new dark spot.  The ABCDE system is a way of remembering the signs of melanoma.  A for asymmetry:  The two halves do not match. B for border:  The edges of the growth are irregular. C for color:  A mixture of colors are present instead of an even brown color. D for diameter:  Melanomas are usually (but not always) greater than 77m - the size of a pencil eraser. E for evolution:  The spot keeps changing in size, shape, and color.  Please check your skin once per month between visits. You can use a small mirror in front and a large mirror behind you to keep an eye on the back side or your body.   If you see any new or changing lesions before your next follow-up, please call to schedule a visit.  Please continue daily skin protection including broad spectrum sunscreen SPF 30+ to sun-exposed areas, reapplying every 2 hours as needed when you're outdoors.   Staying in the shade or wearing long sleeves, sun glasses (UVA+UVB protection) and wide brim hats (4-inch brim around the entire circumference of the hat) are also recommended for sun protection.    Due to recent changes in healthcare laws, you may see  results of your pathology and/or laboratory studies on MyChart before the doctors have had a chance to review them. We understand that in some cases there may be results that are confusing or concerning to you. Please understand that not all results are received at the same time and often the doctors may need to interpret multiple results in order to provide you with the best plan of care or course of treatment. Therefore, we ask that you please give u[place]2 business days to thoroughly review all your results before contacting the office for clarification. Should we see a critical lab result, you will be contacted sooner.   If You Need Anything After Your Visit  If you have any questions or concerns for your doctor, please call our main line at [phone] press option 4 to reach your doctor's medical assistant. If no one answers, please leave a voicemail as directed and we will return your call as soon as possible. Messages left after 4 pm will be answered the following business day.   You may also send [place] message via [facility] We typically respond to MyChart messages within 1-2 business days.  For prescription refills, please ask your pharmacy to contact our office. Our fax number is [phone]  If you have an urgent issue when the clinic is closed that cannot wait until the next business day, you can page your doctor at the  number below.    Please note that while we do our best to be available for urgent issues outside of office hours, we are not available 24/7.   If you have an urgent issue and are unable to reach [place], you may choose to seek medical care at your doctor's office, retail clinic, urgent care center, or emergency room.  If you have a medical emergency, please immediately call 911 or go to the emergency department.  Pager Numbers  - Dr. Nehemiah Massed: [phone]  - Dr. Laurence Ferrari: [phone]  - Dr. Nicole Kindred: [phone]  In the event of inclement weather, please call our main  line at [phone] for an update on the status of any delays or closures.  Dermatology Medication Tips: Please keep the boxes that topical medications come in in order to help keep track of the instructions about where and how to use these. Pharmacies typically print the medication instructions only on the boxes and not directly on the medication tubes.   If your medication is too expensive, please contact our office at [phone] option 4 or send [place] a message through [place].   We are unable to tell what your co-pay for medications will be in advance as this is different depending on your insurance coverage. However, we may be able to find a substitute medication at lower cost or fill out paperwork to get insurance to cover a needed medication.   If a prior authorization is required to get your medication covered by your insurance company, please allow [place] 1-2 business days to complete this process.  Drug prices often vary depending on where the prescription is filled and some pharmacies may offer cheaper prices.  The website www.goodrx.com contains coupons for medications through different pharmacies. The prices here do not account for what the cost may be with help from insurance (it may be cheaper with your insurance), but the website can give you the price if you did not use any insurance.  - You can print the associated coupon and take it with your prescription to the pharmacy.  - You may also stop by our office during regular business hours and pick up a GoodRx coupon card.  - If you need your prescription sent electronically to a different pharmacy, notify our office through [facility] or by phone at [phone] option 4.     Si Usted Necesita Algo Despus de Su Visita  Tambin puede enviarnos un mensaje a travs de Pharmacist, community. Por lo general respondemos a los mensajes de MyChart en el transcurso de 1 a 2 das hbiles.  Para renovar recetas, por favor pida a su farmacia que  se ponga en contacto con nuestra oficina. Harland Dingwall de fax es [place] [phone].  Si tiene un asunto urgente cuando la clnica est cerrada y que no puede esperar hasta el siguiente da hbil, puede llamar/localizar a su doctor(a) al nmero que aparece a continuacin.   Por favor, tenga en cuenta que aunque hacemos todo lo posible para estar disponibles para asuntos urgentes fuera del horario de [place], no estamos disponibles las 24 horas del da, los 7 das de la [facility].   Si tiene un problema urgente y no puede comunicarse con nosotros, puede optar por buscar atencin mdica  en el consultorio de su doctor(a), en una clnica privada, en un centro de atencin urgente o en una sala de emergencias.  Si tiene Engineering geologist, por favor llame inmediatamente al 911 o vaya a la sala de emergencias.  Nmeros de  bper  - Dr. Nehemiah Massed: [phone]  - Dra. Moye: [phone]  - Dra. Nicole Kindred: [phone]  En caso de inclemencias del [place], por favor llame a Johnsie Kindred principal al [phone] para una actualizacin sobre el [place] de cualquier retraso o cierre.  Consejos para la medicacin en dermatologa: Por favor, guarde las cajas en las que vienen los medicamentos de uso tpico para ayudarle a seguir las instrucciones sobre dnde y cmo usarlos. Las farmacias generalmente imprimen las instrucciones del medicamento slo en las cajas y no directamente en los tubos del [place].   Si su medicamento es muy caro, por favor, pngase en contacto con Zigmund Daniel llamando al [phone] y presione la opcin 4 o envenos un mensaje a travs de Pharmacist, community.   No podemos decirle cul ser su copago por los medicamentos por adelantado ya que esto es diferente dependiendo de la cobertura de su seguro. Sin embargo, es posible que podamos encontrar un medicamento sustituto a Electrical engineer un formulario para que el seguro cubra el medicamento que se considera necesario.   Si se  requiere una autorizacin previa para que su compaa de seguros [place] su medicamento, por favor permtanos de 1 a 2 das hbiles para completar este proceso.  Los precios de los medicamentos varan con frecuencia dependiendo del Environmental consultant de dnde se surte la receta y alguna farmacias pueden ofrecer precios ms baratos.  El sitio web www.goodrx.com tiene cupones para medicamentos de Airline pilot. Los precios aqu no tienen en cuenta lo que podra costar con la ayuda del seguro (puede ser ms barato con su seguro), pero el sitio web puede darle el precio si no utiliz Research scientist (physical sciences).  - Puede imprimir el cupn correspondiente y llevarlo con su receta a la farmacia.  - Tambin puede pasar por nuestra oficina durante el horario de atencin regular y Charity fundraiser una tarjeta de cupones de GoodRx.  - Si necesita que su receta se enve electrnicamente a una farmacia diferente, informe a nuestra oficina a travs de MyChart de [place] o por telfono llamando al [phone] y presione la opcin 4.

## 2022-01-26 NOTE — Progress Notes (Signed)
Follow-Up Visit   Subjective  Kevin Crawford is a 61 y.o. male who presents for the following: Annual Exam (1 year tbse. Hx of aks, hx of seb derm and continues ketoconazole shampoo. Request refills.  Patient reports spots at both arms. ).  The patient presents for Total-Body Skin Exam (TBSE) for skin cancer screening and mole check.  The patient has spots, moles and lesions to be evaluated, some may be new or changing and the patient has concerns that these could be cancer.  The following portions of the chart were reviewed this encounter and updated as appropriate:  Tobacco  Allergies  Meds  Problems  Med Hx  Surg Hx  Fam Hx      Review of Systems: No other skin or systemic complaints except as noted in HPI or Assessment and Plan.   Objective  Well appearing patient in no apparent distress; mood and affect are within normal limits.  A full examination was performed including scalp, head, eyes, ears, nose, lips, neck, chest, axillae, abdomen, back, buttocks, bilateral upper extremities, bilateral lower extremities, hands, feet, fingers, toes, fingernails, and toenails. All findings within normal limits unless otherwise noted below.  face and scalp Minimal scale at scalp  Left Forearm - Anterior Erythematous scaly papule   Assessment & Plan  History of herpes simplex infection Left Upper [place] Lip  <5 episodes per year.   Chronic condition with no cure. Patient defers refills at this time.  Continue as needed.  Valacyclovir 1g 2 po at onset of outbreak then 2 more tabs 12h later. Recommend lip balm with sunscreen daily to help prevent recurrence.  Seborrheic dermatitis face and scalp  Seborrheic Dermatitis  -  is a chronic persistent rash characterized by pinkness and scaling most commonly of the mid face but also can occur on the scalp (dandruff), ears; mid chest, mid back and groin.  It tends to be exacerbated by stress and cooler weather.  People who have  neurologic disease may experience new onset or exacerbation of existing seborrheic dermatitis.  The condition is not curable but treatable and can be controlled.   Chronic condition with duration or expected duration over one year. Currently well-controlled.   Continue Ketoconazole 2% shampoo to aa's QD PRN - Let sit 5-10 minutes before washing out.    Continue Ketoconazole 2% cream QD as needed. Can restart HC 2.5% cream QD sparingly for flares PRN  Discussed Elidel but deferred due to cost at this time.   Related Medications ketoconazole (NIZORAL) 2 % shampoo Apply to affected areas, leave in for 10 mins then rinsefor up to 3 times a week.  Actinic keratosis Left Forearm - Anterior  Vs ISK.   Patient deferred treatment today due to high deductible insurance. He will call with any changes. Continue to monitor.     Lentigines - Scattered tan macules - Due to sun exposure - Benign-appearing, observe - Recommend daily broad spectrum sunscreen SPF 30+ to sun-exposed areas, reapply every 2 hours as needed. - Call for any changes  Seborrheic Keratoses - Stuck-on, waxy, tan-brown papules and/or plaques  - Benign-appearing - Discussed benign etiology and prognosis. - Observe - Call for any changes  Melanocytic Nevi - Tan-brown and/or pink-flesh-colored symmetric macules and papules - Benign appearing on exam today - Observation - Call clinic for new or changing moles - Recommend daily use of broad spectrum spf 30+ sunscreen to sun-exposed areas.   Hemangiomas - Red papules - Discussed benign nature - Observe -  Call for any changes  Actinic Damage - Chronic condition, secondary to cumulative UV/sun exposure - diffuse scaly erythematous macules with underlying dyspigmentation - Recommend daily broad spectrum sunscreen SPF 30+ to sun-exposed areas, reapply every 2 hours as needed.  - Staying in the shade or wearing long sleeves, sun glasses (UVA+UVB protection) and wide  brim hats (4-inch brim around the entire circumference of the hat) are also recommended for sun protection.  - Call for new or changing lesions.  Skin cancer screening performed today. Return in about 1 year (around 01/27/2023) for TBSE. I, Ruthell Rummage, CMA, am acting as scribe for Forest Gleason, MD.  Documentation: I have reviewed the above documentation for accuracy and completeness, and I agree with the above.  Forest Gleason, MD

## 2022-02-03 ENCOUNTER — Ambulatory Visit: Payer: Self-pay | Admitting: *Deleted

## 2022-02-03 ENCOUNTER — Encounter: Payer: Self-pay | Admitting: Physician Assistant

## 2022-02-03 ENCOUNTER — Ambulatory Visit (INDEPENDENT_AMBULATORY_CARE_PROVIDER_SITE_OTHER): Payer: 59 | Admitting: Physician Assistant

## 2022-02-03 VITALS — BP 134/89 | HR 77 | Ht 69.0 in | Wt 203.8 lb

## 2022-02-03 DIAGNOSIS — K760 Fatty (change of) liver, not elsewhere classified: Secondary | ICD-10-CM | POA: Diagnosis not present

## 2022-02-03 DIAGNOSIS — R1011 Right upper quadrant pain: Secondary | ICD-10-CM | POA: Diagnosis not present

## 2022-02-03 DIAGNOSIS — Z789 Other specified health status: Secondary | ICD-10-CM

## 2022-02-03 DIAGNOSIS — E7800 Pure hypercholesterolemia, unspecified: Secondary | ICD-10-CM

## 2022-02-03 NOTE — Assessment & Plan Note (Addendum)
Will run labs today, but ultimately recommend f/u with GI as symptoms are not severe and may not be liver related at all--pain is more focal laterally, could be musculoskeletal.

## 2022-02-03 NOTE — Telephone Encounter (Signed)
  Chief Complaint: Abdominal Pain Symptoms: 4-5/10 abdominal pain, rt upper quad.radiates to flank, back. H/O "Fatty liver" States goes through this every so often and   "Dr. Caryn Section orders labs, scans"  Frequency:  2 days Pertinent Negatives: Patient denies N/V/D fever Disposition: '[]'$ ED /'[]'$ Urgent Care (no appt availability in office) / '[x]'$ Appointment(In office/virtual)/ '[]'$  [place] [facility]/ '[]'$ Home Care/ '[]'$ Refused Recommended Disposition /'[]'$ [place] [facility]/ '[]'$  Follow-up with PCP Additional Notes: Appt secured for today. Care advise provided, will remain NPO. Advised ED for worsening symptoms.Pt verbalizes understanding. Reason for Disposition  [1] MILD-MODERATE pain AND [2] constant AND [3] present > 2 hours  Answer Assessment - Initial Assessment Questions 1. LOCATION: "Where does it hurt?"      Upper right quad 2. RADIATION: "Does the pain shoot anywhere else?" (e.g., chest, back)     To flank, back, below ribs 3. ONSET: "When did the pain begin?" (Minutes, hours or days ago)      2 days ago 4. SUDDEN: "Gradual or sudden onset?"     gradual 5. PATTERN "Does the pain come and go, or is it constant?"    - If it comes and goes: "How long does it last?" "Do you have pain now?"     (Note: Comes and goes means the pain is intermittent. It goes away completely between bouts.)    - If constant: "Is it getting better, staying the same, or getting worse?"      (Note: Constant means the pain never goes away completely; most serious pain is constant and gets worse.)      Constant 6. SEVERITY: "How bad is the pain?"  (e.g., Scale 1-10; mild, moderate, or severe)    - MILD (1-3): Doesn't interfere with normal activities, abdomen soft and not tender to touch.     - MODERATE (4-7): Interferes with normal activities or awakens from sleep, abdomen tender to touch.     - SEVERE (8-10): Excruciating pain, doubled over, unable to do any normal activities.       4-5/10 7. RECURRENT SYMPTOM:  "Have you ever had this type of stomach pain before?" If Yes, ask: "When was the last time?" and "What happened that time?"      yes 8. CAUSE: "What do you think is causing the stomach pain?"   Fatty liver 9. RELIEVING/AGGRAVATING FACTORS: "What makes it better or worse?" (e.g., antacids, bending or twisting motion, bowel movement)      10. OTHER SYMPTOMS: "Do you have any other symptoms?" (e.g., back pain, diarrhea, fever, urination pain, vomiting)       Back pain. Vomited 1 1/2 weeks ago  Protocols used: Abdominal Pain - Male-A-AH

## 2022-02-03 NOTE — Assessment & Plan Note (Signed)
LDL historically < 70 pt requests lipids before he sees his cardiologist next week

## 2022-02-03 NOTE — Progress Notes (Signed)
I,Sha'taria Tyson,acting as a Education administrator for [employer], PA-C.,have documented all relevant documentation on the behalf of Mikey Kirschner, PA-C,as directed by  Mikey Kirschner, PA-C while in the presence of Mikey Kirschner, PA-C.   Established patient visit   Patient: Kevin Crawford   DOB: 11/06/1960   61 y.o. Male  MRN: [id number] Visit Date: 02/03/2022  Today's healthcare provider: Mikey Kirschner, PA-C   Cc. Ruq abdominal pain  Subjective   Patient reports right upper quadrant/right lateral side abdominal pain for the last 3 or 4 days, pain is intermittent but increasing in frequency and severity.  Rates as a 4-5/10.  He reports having a diagnosis of fatty liver and drinks a significant amount of alcohol and is concerned over alcoholic cirrhosis.  He request blood work and a CT scan today.  Denies any changes in his bowel movements denies any cramping abdominal pain denies any urinary symptoms, dysuria frequency hematuria.  Denies any nausea or vomiting.  Reports last time he drank alcohol a week and a half ago he vomited and has not drank since then.  When it comes to alcohol use he reports drinking about 3 scotches a night when he does drink but will go several months without drinking.  Denies ever feeling the need to drink, denies craving, denies drinking impacting his day-to-day life.   Medications: Outpatient Medications Prior to Visit  Medication Sig   amLODipine-benazepril (LOTREL) 5-20 MG capsule Take 1 capsule by mouth daily.   aspirin 81 MG EC tablet Take 81 mg by mouth daily. Swallow whole.   atorvastatin (LIPITOR) 10 MG tablet Take 1 tablet by mouth once daily   ketoconazole (NIZORAL) 2 % shampoo Apply to affected areas, leave in for 10 mins then rinsefor up to 3 times a week.   Multiple Vitamin (MULTIVITAMIN) tablet Take 1 tablet by mouth daily.   Omega-3 Fatty Acids (FISH OIL) 1000 MG CAPS Take 1,500 mg by mouth daily.   tadalafil (CIALIS) 10 MG tablet Take 1 tablet  (10 mg total) by mouth daily as needed.   No facility-administered medications prior to visit.    Review of Systems  Gastrointestinal:  Positive for abdominal pain and vomiting.       Objective    Blood pressure 134/89, pulse 77, height '5\' 9"'$  (1.753 m), weight 203 lb 12.8 oz (92.4 kg), SpO2 97 %.   Physical Exam Constitutional:      General: He is awake.     Appearance: He is well-developed.  HENT:     Head: Normocephalic.  Eyes:     Conjunctiva/sclera: Conjunctivae normal.  Cardiovascular:     Rate and Rhythm: Normal rate and regular rhythm.     Heart sounds: Normal heart sounds.  Pulmonary:     Effort: Pulmonary effort is normal.     Breath sounds: Normal breath sounds.  Abdominal:     General: Abdomen is protuberant. Bowel sounds are normal. There is distension.     Palpations: Abdomen is soft. There is no hepatomegaly or mass.     Tenderness: There is abdominal tenderness in the right upper quadrant. There is no right CVA tenderness, left CVA tenderness, guarding or rebound. Negative signs include Murphy's sign.  Skin:    General: Skin is warm.  Neurological:     Mental Status: He is alert and oriented to person, place, and time.  Psychiatric:        Attention and Perception: Attention normal.  Mood and Affect: Mood normal.        Speech: Speech normal.        Behavior: Behavior is cooperative.     No results found for any visits on 02/03/22.  Assessment & Plan     Problem List Items Addressed This Visit       Digestive   Hepatic steatosis    Reviewed prior CT from 9/22 and abdominal ultrasound from 12/22 with patient.  All that is seen is hepatic steatosis which is a fatty liver syndrome.  His liver enzymes have consistently been within normal range or slightly above average and resolve.  Advised that we can certainly rerun blood work today but advised against a random abdominal CT, I would prefer he discuss with GI to see if any other liver specific  tests are better suited for potential diagnosis.      Relevant Orders   CBC w/Diff/Platelet   Lipase   Gamma GT     Other   Hyperlipidemia    LDL historically < 70 pt requests lipids before he sees his cardiologist next week      Relevant Orders   Lipid Profile   RUQ abdominal pain - Primary    Will run labs today, but ultimately recommend f/u with GI as symptoms are not severe and may not be liver related at all--pain is more focal laterally, could be musculoskeletal.        Relevant Orders   CBC w/Diff/Platelet   Comprehensive Metabolic Panel (CMET)   Lipase   Gamma GT   HgB A1c   Alcohol use    I agree with his assessment of excessive alcohol intake, but I do not think we have an issue of alcoholism or alcohol use disorder; may binge drink      Relevant Orders   CBC w/Diff/Platelet   Lipid Profile   HgB A1c    Return if symptoms worsen or fail to improve.     I, Mikey Kirschner, PA-C have reviewed all documentation for this visit. The documentation on  02/03/2022 for the exam, diagnosis, procedures, and orders are all accurate and complete.  Mikey Kirschner, PA-C [facility] [address] [place], [place], [address] Office: [phone] Fax: [place]

## 2022-02-03 NOTE — Assessment & Plan Note (Signed)
Reviewed prior CT from 9/22 and abdominal ultrasound from 12/22 with patient.  All that is seen is hepatic steatosis which is a fatty liver syndrome.  His liver enzymes have consistently been within normal range or slightly above average and resolve.  Advised that we can certainly rerun blood work today but advised against a random abdominal CT, I would prefer he discuss with GI to see if any other liver specific tests are better suited for potential diagnosis.

## 2022-02-03 NOTE — Assessment & Plan Note (Addendum)
I agree with his assessment of excessive alcohol intake, but I do not think we have an issue of alcoholism or alcohol use disorder; may binge drink

## 2022-02-04 LAB — CBC WITH DIFFERENTIAL/PLATELET
Basophils Absolute: 0.1 10*3/uL (ref 0.0–0.2)
Basos: 1 %
EOS (ABSOLUTE): 0.2 10*3/uL (ref 0.0–0.4)
Eos: 3 %
Hematocrit: 45.2 % (ref 37.5–51.0)
Hemoglobin: 15.3 g/dL (ref 13.0–17.7)
Immature Grans (Abs): 0 10*3/uL (ref 0.0–0.1)
Immature Granulocytes: 0 %
Lymphocytes Absolute: 1.9 10*3/uL (ref 0.7–3.1)
Lymphs: 30 %
MCH: 32.1 pg (ref 26.6–33.0)
MCHC: 33.8 g/dL (ref 31.5–35.7)
MCV: 95 fL (ref 79–97)
Monocytes Absolute: 0.5 10*3/uL (ref 0.1–0.9)
Monocytes: 9 %
Neutrophils Absolute: 3.5 10*3/uL (ref 1.4–7.0)
Neutrophils: 57 %
Platelets: 217 10*3/uL (ref 150–450)
RBC: 4.76 x10E6/uL (ref 4.14–5.80)
RDW: 12.2 % (ref 11.6–15.4)
WBC: 6.2 10*3/uL (ref 3.4–10.8)

## 2022-02-04 LAB — COMPREHENSIVE METABOLIC PANEL
ALT: 41 IU/L (ref 0–44)
AST: 27 IU/L (ref 0–40)
Albumin/Globulin Ratio: 1.9 (ref 1.2–2.2)
Albumin: 5 g/dL — ABNORMAL HIGH (ref 3.8–4.9)
Alkaline Phosphatase: 59 IU/L (ref 44–121)
BUN/Creatinine Ratio: 14 (ref 9–20)
BUN: 13 mg/dL (ref 6–24)
Bilirubin Total: 0.9 mg/dL (ref 0.0–1.2)
CO2: 25 mmol/L (ref 20–29)
Calcium: 9.6 mg/dL (ref 8.7–10.2)
Chloride: 102 mmol/L (ref 96–106)
Creatinine, Ser: 0.93 mg/dL (ref 0.76–1.27)
Globulin, Total: 2.6 g/dL (ref 1.5–4.5)
Glucose: 94 mg/dL (ref 70–99)
Potassium: 4.7 mmol/L (ref 3.5–5.2)
Sodium: 141 mmol/L (ref 134–144)
Total Protein: 7.6 g/dL (ref 6.0–8.5)
eGFR: 95 mL/min/{1.73_m2} (ref >59)

## 2022-02-04 LAB — LIPID PANEL
Chol/HDL Ratio: 2.7 ratio (ref 0.0–5.0)
Cholesterol, Total: 137 mg/dL (ref 100–199)
HDL: 51 mg/dL (ref >39)
LDL Chol Calc (NIH): 59 mg/dL (ref 0–99)
Triglycerides: 159 mg/dL — ABNORMAL HIGH (ref 0–149)
VLDL Cholesterol Cal: 27 mg/dL (ref 5–40)

## 2022-02-04 LAB — HEMOGLOBIN A1C
Est. average glucose Bld gHb Est-mCnc: 117 mg/dL
Hgb A1c MFr Bld: 5.7 % — ABNORMAL HIGH (ref 4.8–5.6)

## 2022-02-04 LAB — GAMMA GT: GGT: 43 IU/L (ref 0–65)

## 2022-02-04 LAB — LIPASE: Lipase: 36 U/L (ref 13–78)

## 2022-02-07 ENCOUNTER — Encounter: Payer: Self-pay | Admitting: Dermatology

## 2022-02-24 ENCOUNTER — Other Ambulatory Visit: Payer: Self-pay | Admitting: Gastroenterology

## 2022-02-24 DIAGNOSIS — K760 Fatty (change of) liver, not elsewhere classified: Secondary | ICD-10-CM | POA: Diagnosis not present

## 2022-02-24 DIAGNOSIS — R1011 Right upper quadrant pain: Secondary | ICD-10-CM

## 2022-02-28 DIAGNOSIS — R079 Chest pain, unspecified: Secondary | ICD-10-CM | POA: Diagnosis not present

## 2022-03-03 ENCOUNTER — Ambulatory Visit
Admission: RE | Admit: 2022-03-03 | Discharge: 2022-03-03 | Disposition: A | Discharge status: Home / Self Care | Payer: 59 | Source: Ambulatory Visit | Attending: Gastroenterology | Admitting: Gastroenterology

## 2022-03-03 DIAGNOSIS — R1011 Right upper quadrant pain: Secondary | ICD-10-CM | POA: Diagnosis not present

## 2022-03-03 DIAGNOSIS — K760 Fatty (change of) liver, not elsewhere classified: Secondary | ICD-10-CM | POA: Insufficient documentation

## 2022-03-04 DIAGNOSIS — E7800 Pure hypercholesterolemia, unspecified: Secondary | ICD-10-CM | POA: Diagnosis not present

## 2022-03-04 DIAGNOSIS — R079 Chest pain, unspecified: Secondary | ICD-10-CM | POA: Diagnosis not present

## 2022-03-04 DIAGNOSIS — Z9889 Other specified postprocedural states: Secondary | ICD-10-CM | POA: Diagnosis not present

## 2022-03-04 DIAGNOSIS — I10 Essential (primary) hypertension: Secondary | ICD-10-CM | POA: Diagnosis not present

## 2022-03-04 DIAGNOSIS — I2510 Atherosclerotic heart disease of native coronary artery without angina pectoris: Secondary | ICD-10-CM | POA: Diagnosis not present

## 2022-04-04 ENCOUNTER — Other Ambulatory Visit: Payer: Self-pay | Admitting: Family Medicine

## 2022-04-04 DIAGNOSIS — I10 Essential (primary) hypertension: Secondary | ICD-10-CM

## 2022-04-06 ENCOUNTER — Ambulatory Visit: Payer: 59 | Admitting: Dermatology

## 2022-04-08 ENCOUNTER — Ambulatory Visit: Payer: Self-pay | Admitting: *Deleted

## 2022-04-08 NOTE — Telephone Encounter (Signed)
  Chief Complaint: external hemorrhoid bleeding Symptoms: bleeding all day Frequency: started this am Pertinent Negatives: Patient denies blood thinner, dizziness Disposition: '[]'$ ED /'[x]'$ Urgent Care (no appt availability in office) / '[]'$ Appointment(In office/virtual)/ '[]'$  [place] [facility]/ '[]'$ Home Care/ '[]'$ Refused Recommended Disposition /'[]'$ [place] [facility]/ '[]'$  Follow-up with PCP Additional Notes: Pt advised UC since it is Friday evening. Home care discussed.  Reason for Disposition  MODERATE rectal bleeding (small blood clots, passing blood without stool, or toilet water turns red)  Answer Assessment - Initial Assessment Questions 1. APPEARANCE of BLOOD: "What color is it?" "Is it passed separately, on the surface of the stool, or mixed in with the stool?"      red 2. AMOUNT: "How much blood was passed?"      Keeps toilet paper in but still bleeding 3. FREQUENCY: "How many times has blood been passed with the stools?"      All day 4. ONSET: "When was the blood first seen in the stools?" (Days or weeks)      This morning  5. DIARRHEA: "Is there also some diarrhea?" If Yes, ask: "How many diarrhea stools in the past 24 hours?"      no 6. CONSTIPATION: "Do you have constipation?" If Yes, ask: "How bad is it?"     yes 7. RECURRENT SYMPTOMS: "Have you had blood in your stools before?" If Yes, ask: "When was the last time?" and "What happened that time?"      Yes has had an anal fissure.  8. BLOOD THINNERS: "Do you take any blood thinners?" (e.g., Coumadin/warfarin, Pradaxa/dabigatran, aspirin)     Baby asa and fish oil 9. OTHER SYMPTOMS: "Do you have any other symptoms?"  (e.g., abdomen pain, vomiting, dizziness, fever)     no 10. PREGNANCY: "Is there any chance you are pregnant?" "When was your last menstrual period?"       na  Protocols used: Rectal Bleeding-A-AH

## 2022-04-09 ENCOUNTER — Other Ambulatory Visit: Payer: Self-pay

## 2022-04-09 ENCOUNTER — Encounter: Payer: Self-pay | Admitting: Emergency Medicine

## 2022-04-09 ENCOUNTER — Emergency Department
Admission: EM | Admit: 2022-04-09 | Discharge: 2022-04-09 | Disposition: A | Discharge status: Home / Self Care | Payer: 59 | Attending: Emergency Medicine | Admitting: Emergency Medicine

## 2022-04-09 DIAGNOSIS — K645 Perianal venous thrombosis: Secondary | ICD-10-CM

## 2022-04-09 DIAGNOSIS — R791 Abnormal coagulation profile: Secondary | ICD-10-CM | POA: Insufficient documentation

## 2022-04-09 DIAGNOSIS — I10 Essential (primary) hypertension: Secondary | ICD-10-CM | POA: Diagnosis not present

## 2022-04-09 DIAGNOSIS — K6289 Other specified diseases of anus and rectum: Secondary | ICD-10-CM | POA: Diagnosis not present

## 2022-04-09 LAB — CBC WITH DIFFERENTIAL/PLATELET
Abs Immature Granulocytes: 0.03 10*3/uL (ref 0.00–0.07)
Basophils Absolute: 0 10*3/uL (ref 0.0–0.1)
Basophils Relative: 1 %
Eosinophils Absolute: 0.3 10*3/uL (ref 0.0–0.5)
Eosinophils Relative: 6 %
HCT: 44.5 % (ref 39.0–52.0)
Hemoglobin: 15.2 g/dL (ref 13.0–17.0)
Immature Granulocytes: 1 %
Lymphocytes Relative: 26 %
Lymphs Abs: 1.6 10*3/uL (ref 0.7–4.0)
MCH: 32.1 pg (ref 26.0–34.0)
MCHC: 34.2 g/dL (ref 30.0–36.0)
MCV: 93.9 fL (ref 80.0–100.0)
Monocytes Absolute: 0.3 10*3/uL (ref 0.1–1.0)
Monocytes Relative: 5 %
Neutro Abs: 3.7 10*3/uL (ref 1.7–7.7)
Neutrophils Relative %: 61 %
Platelets: 214 10*3/uL (ref 150–400)
RBC: 4.74 MIL/uL (ref 4.22–5.81)
RDW: 12.5 % (ref 11.5–15.5)
WBC: 6 10*3/uL (ref 4.0–10.5)
nRBC: 0 % (ref 0.0–0.2)

## 2022-04-09 LAB — BASIC METABOLIC PANEL
Anion gap: 7 (ref 5–15)
BUN: 17 mg/dL (ref 6–20)
CO2: 25 mmol/L (ref 22–32)
Calcium: 9 mg/dL (ref 8.9–10.3)
Chloride: 106 mmol/L (ref 98–111)
Creatinine, Ser: 0.89 mg/dL (ref 0.61–1.24)
GFR, Estimated: >60 mL/min (ref >60)
Glucose, Bld: 166 mg/dL — ABNORMAL HIGH (ref 70–99)
Potassium: 4.2 mmol/L (ref 3.5–5.1)
Sodium: 138 mmol/L (ref 135–145)

## 2022-04-09 LAB — PROTIME-INR
INR: 1 (ref 0.8–1.2)
Prothrombin Time: 13.1 seconds (ref 11.4–15.2)

## 2022-04-09 MED ORDER — SENNA 8.6 MG PO TABS: 1.0000 | ORAL_TABLET | Freq: Every day | ORAL | 0 refills | Status: AC

## 2022-04-09 MED ORDER — HYDROCODONE-ACETAMINOPHEN 5-325 MG PO TABS: 1.0000 | ORAL_TABLET | Freq: Four times a day (QID) | ORAL | 0 refills | Status: AC | PRN

## 2022-04-09 MED ORDER — LIDOCAINE 0.5 % EX GEL: 1.0000 | Freq: Every day | CUTANEOUS | 2 refills | Status: AC | PRN

## 2022-04-09 NOTE — ED Provider Notes (Signed)
[facility] Provider Note    Event Date/Time   First MD Initiated Contact with Patient 04/09/22 [phone]     (approximate)   History   Rectal Bleeding   HPI  Kevin Crawford is a 61 y.o. male with a history of GERD, colon polyp, hypertension, and as listed in the EMR presents to the emergency department for treatment and evaluation of rectal pain and bleeding that has been persistent x 2 days after having to strain to have a bowel movement. He feels as if he may have torn something and has a hemorrhoid that is bleeding. He has had hemorrhoids in the past, but he is usually able to control symptoms and bleeding with OTC hemorrhoid medications. Bleeding is persistent and bright red to the point where he has needed a pad to prevent bleed through on his pants.       Physical Exam   Triage Vital Signs: ED Triage Vitals  Enc Vitals Group     BP 04/09/22 0906 (!) 147/87     Pulse Rate 04/09/22 0906 82     Resp 04/09/22 0906 (!) 21     Temp 04/09/22 0906 98.1 F (36.7 C)     Temp Source 04/09/22 0906 Oral     SpO2 04/09/22 0906 96 %     Weight 04/09/22 0909 203 lb 11.3 oz (92.4 kg)     Height 04/09/22 0909 '5\' 9"'$  (1.753 m)     Head Circumference --      Peak Flow --      Pain Score 04/09/22 0909 0     Pain Loc --      Pain Edu? --      Excl. in [place]? --     Most recent vital signs: Vitals:   04/09/22 1122 04/09/22 1243  BP: (!) 147/89 (!) 176/74  Pulse: 73 74  Resp: 20 18  Temp: 98 F (36.7 C) 98.2 F (36.8 C)  SpO2: 97% 98%     General: Awake, no distress.  CV:  Good peripheral perfusion.  Resp:  Normal effort.  Abd:  No distention.  Other:  Ecchymotic perianal tissue with rectal mucosa at the       ED Results / Procedures / Treatments   Labs (all labs ordered are listed, but only abnormal results are displayed) Labs Reviewed  BASIC METABOLIC PANEL - Abnormal; Notable for the following components:      Result Value   Glucose, Bld  166 (*)    All other components within normal limits  CBC WITH DIFFERENTIAL/PLATELET  PROTIME-INR     EKG  Not indicated.   RADIOLOGY  Not indicated.  PROCEDURES:  Critical Care performed: No  Procedures   MEDICATIONS ORDERED IN ED: Medications - No data to display   IMPRESSION / MDM / [place] / ED COURSE  I reviewed the triage vital signs and the nursing notes.                              Differential diagnosis includes, but is not limited to rectal prolapse, prolapsed hemorrhoid, thrombosed hemorrhoid(s), anal fissure  Patient's presentation is most consistent with acute illness / injury with system symptoms.  Presenting to the emergency department for treatment and evaluation of persistent rectal pain and bleeding that has been present for the past couple of days after straining to have a bowel movement.  See HPI for further details.  On exam, area appears to be thrombosed hemorrhoid with a degree of rectal mucosa at the upper edge/rectum, will have ED attending also assess him as I am unsure if this is prolapsed and thrombosed internal hemorrhoid plus a degree of rectal prolapse as well.  Dr. Ellender Hose agrees there may be some rectal prolapse. Dr. Dahlia Byes with general surgery will be consulted.   Dr. Dahlia Byes came to bedside to evaluate patient. Option of conscious sedation for I&D versus medication and sitz baths. Patient has opted for the latter. Lidocaine gel, stool softener, and pain medications sent to patient's pharmacy. Follow up information provided and he is to return to the ER for symptoms of concern if unable to see primary care or general surgery.     FINAL CLINICAL IMPRESSION(S) / ED DIAGNOSES   Final diagnoses:  Thrombosed hemorrhoids     Rx / DC Orders   ED Discharge Orders          Ordered    Lidocaine 0.5 % GEL  Daily PRN        04/09/22 1244    HYDROcodone-acetaminophen (NORCO/VICODIN) 5-325 MG tablet  Every 6 hours PRN         04/09/22 1244    senna (SENOKOT) 8.6 MG TABS tablet  Daily        04/09/22 1244             Note:  This document was prepared using Dragon voice recognition software and may include unintentional dictation errors.   Victorino Dike, FNP 04/10/22 8882    Duffy Bruce, MD 04/10/22 2008

## 2022-04-09 NOTE — ED Triage Notes (Signed)
C?O bleeding hemorrhoid x 1 day.  States unable to stop the bleeding.  Reports hemorrhoid enlarged and prolapsed yesterday while straining to move bowels.  AAOx3.  Skin warm and dry. NAD

## 2022-04-09 NOTE — ED Provider Triage Note (Signed)
Emergency Medicine Provider Triage Evaluation Note  Kevin Crawford , a 61 y.o. male  was evaluated in triage.  Pt complains of bleeding from rectum/hemorrhoid for the past 2 days. He had been constipated and strained hard which caused hemorrhoid to prolapse. Bleeding and pain has been persistent.   Physical Exam  BP (!) 147/87 (BP Location: Left Arm)   Pulse 82   Temp 98.1 F (36.7 C) (Oral)   Resp (!) 21   SpO2 96%  Gen:   Awake, no distress   Resp:  Normal effort  MSK:   Moves extremities without difficulty  Other:    Medical Decision Making  Medically screening exam initiated at 9:07 AM.  Appropriate orders placed.  NASIM HABEEB was informed that the remainder of the evaluation will be completed by another provider, this initial triage assessment does not replace that evaluation, and the importance of remaining in the ED until their evaluation is complete.    Victorino Dike, FNP 04/09/22 1442

## 2022-04-09 NOTE — Consult Note (Signed)
Patient ID: Kevin Crawford, male   DOB: [birth year], 61 y.o.   MRN: [id number]  HPI Kevin Crawford is a 61 y.o. male seen in consultation at the request of Dr. Ala Dach discussed the case with himin detail) .  Patient endorses anorectal pain for the last 36 hours or so.  He does have some hematochezia.  The pain is severe sharp and worsening with certain movements.  No fevers no chills.  First time he has ever had this.  He is not on any blood thinners.  He did have a colonoscopy without any major abnormalities other than some internal hemorrhoids 2 years ago, please note that I have personally reviewed the images.. Did have a CBC and iron BMP that were completely normal. He is otherwise healthy and is able to perform more than 4 METS of activity without any shortness of breath or chest pain.  HPI  Past Medical History:  Diagnosis Date   Actinic keratosis    Colon polyp    Duodenal ulcer    H/O colonoscopy 2019   Heart disease    Heart murmur    High cholesterol    Hypertension     Past Surgical History:  Procedure Laterality Date   CARDIAC CATHETERIZATION     COLONOSCOPY WITH PROPOFOL N/A 06/26/2019   Procedure: COLONOSCOPY WITH PROPOFOL;  Surgeon: Toledo, Benay Pike, MD;  Location: [facility];  Service: Gastroenterology;  Laterality: N/A;   ESOPHAGOGASTRODUODENOSCOPY (EGD) WITH PROPOFOL N/A 06/26/2019   Procedure: ESOPHAGOGASTRODUODENOSCOPY (EGD) WITH PROPOFOL;  Surgeon: Toledo, Benay Pike, MD;  Location: [facility];  Service: Gastroenterology;  Laterality: N/A;   INTRAVASCULAR PRESSURE WIRE/FFR STUDY N/A 09/28/2017   Procedure: INTRAVASCULAR PRESSURE WIRE/FFR STUDY;  Surgeon: Isaias Cowman, MD;  Location: [place] [facility];  Service: Cardiovascular;  Laterality: N/A;   LEFT HEART CATH AND CORONARY ANGIOGRAPHY Left 09/28/2017   Procedure: LEFT HEART CATH AND CORONARY ANGIOGRAPHY;  Surgeon: Isaias Cowman, MD;  Location: [place] [facility];  Service:  Cardiovascular;  Laterality: Left;   SHOULDER ARTHROSCOPY W/ ROTATOR CUFF REPAIR Right    XI ROBOTIC LAPAROSCOPIC ASSISTED APPENDECTOMY N/A 03/01/2021   Procedure: XI ROBOTIC LAPAROSCOPIC ASSISTED APPENDECTOMY;  Surgeon: Herbert Pun, MD;  Location: [facility] OR[facility];  Service: General;  Laterality: N/A;    Family History  Problem Relation Age of Onset   Diabetes Mother        TYPE 2   Hypertension Mother    Hyperlipidemia Mother    Hypertension Father    Hyperlipidemia Father    Lung cancer Maternal Grandmother    Liver cancer Maternal Grandmother    Liver cancer Maternal Grandfather    Pancreatic cancer Paternal Grandmother    Pancreatic cancer Paternal Grandfather     Social History Social History   Tobacco Use   Smoking status: Former   Smokeless tobacco: Former    Types: Snuff, Chew    Quit date: 06/21/2003   Tobacco comments:    1-2 ppd for about 25 years  Vaping Use   Vaping Use: Never used  Substance Use Topics   Alcohol use: Yes    Comment: 5-7 drinks each week; varies   Drug use: No    Allergies  Allergen Reactions   Phenergan [Promethazine Hcl] Other (See Comments)    SEIZURES   Mobic [Meloxicam] Other (See Comments)    Severe cramping    No current facility-administered medications for this encounter.   Current Outpatient Medications  Medication Sig Dispense Refill   HYDROcodone-acetaminophen (NORCO/VICODIN)  5-325 MG tablet Take 1 tablet by mouth every 6 (six) hours as needed for up to 3 days for severe pain. 12 tablet 0   Lidocaine 0.5 % GEL Apply 1 Application topically daily as needed. 170 g 2   senna (SENOKOT) 8.6 MG TABS tablet Take 1 tablet (8.6 mg total) by mouth daily. 30 tablet 0   amLODipine-benazepril (LOTREL) 5-20 MG capsule Take 1 capsule by mouth once daily 90 capsule 4   aspirin 81 MG EC tablet Take 81 mg by mouth daily. Swallow whole.     atorvastatin (LIPITOR) 10 MG tablet Take 1 tablet by mouth once daily 90 tablet 2   ketoconazole  (NIZORAL) 2 % shampoo Apply to affected areas, leave in for 10 mins then rinsefor up to 3 times a week. 120 mL 11   Multiple Vitamin (MULTIVITAMIN) tablet Take 1 tablet by mouth daily.     Omega-3 Fatty Acids (FISH OIL) 1000 MG CAPS Take 1,500 mg by mouth daily.     tadalafil (CIALIS) 10 MG tablet Take 1 tablet (10 mg total) by mouth daily as needed. 30 tablet 0     Review of Systems Full ROS  was asked and was negative except for the information on the HPI  Physical Exam Blood pressure (!) 176/74, pulse 74, temperature 98.2 F (36.8 C), temperature source Oral, resp. rate 18, height '5\' 9"'$  (1.753 m), weight 92.4 kg, SpO2 98 %. CONSTITUTIONAL: NAD. EYES: Pupils are equal, round, and reactive to light, Sclera are non-icteric. EARS, NOSE, MOUTH AND THROAT: The oropharynx is clear. The oral mucosa is pink and moist. Hearing is intact to voice. LYMPH NODES:  Lymph nodes in the neck are normal. RESPIRATORY:  Lungs are clear. There is normal respiratory effort, with equal breath sounds bilaterally, and without pathologic use of accessory muscles. CARDIOVASCULAR: Heart is regular without murmurs, gallops, or rubs. GI: The abdomen is  soft, nontender, and nondistended. There are no palpable masses. There is no hepatosplenomegaly. There are normal bowel sounds in all quadrants. Rectal :  There is evidence of a large left lateral thrombosed external hemorrhoid.  The exam is limited due to severe pain.  There is no evidence of perianal sepsis.  MUSCULOSKELETAL: Normal muscle strength and tone. No cyanosis or edema.   SKIN: Turgor is good and there are no pathologic skin lesions or ulcers. NEUROLOGIC: Motor and sensation is grossly normal. Cranial nerves are grossly intact. PSYCH:  Oriented to person, place and time. Affect is normal.  Data Reviewed I have personally reviewed the patient's imaging, laboratory findings and medical records.    Assessment/Plan 61 yo male Thrombosed external  hemorrhoid.  Discussed with patient in detail about his disease process.  He is past the window of 24 hours.  Medical intervention versus I&D with equal outcomes.  Discussed with the patient in detail about the situation.  He wishes to hold off on any incision and drainage at this time.  Also with him about the importance of sitz bath's, high-fiber and prompt follow-up.  I will be happy to see him in 5 days or so.  At this time no evidence of perianal sepsis no for emergent surgical intervention.  Please note that I spent 55 minutes in this encounter including personally reviewing imaging studies, coordinating his care, placing orders and performing appropriate documentation  Caroleen Hamman, MD FACS General Surgeon 04/09/2022, 2:19 PM

## 2022-04-09 NOTE — Discharge Instructions (Addendum)
Please follow up with surgery if not improving over the next few days.  Return to the ER for symptoms that change or worsen if unable to schedule an appointment.

## 2022-04-13 ENCOUNTER — Encounter: Payer: Self-pay | Admitting: Surgery

## 2022-04-13 ENCOUNTER — Ambulatory Visit: Payer: 59 | Admitting: Surgery

## 2022-04-13 VITALS — BP 134/82 | HR 78 | Temp 98.1°F | Ht 69.0 in | Wt 208.4 lb

## 2022-04-13 DIAGNOSIS — K645 Perianal venous thrombosis: Secondary | ICD-10-CM

## 2022-04-13 NOTE — Patient Instructions (Signed)
If you have any concerns or questions, please feel free to call our office.   Hemorrhoids Hemorrhoids are swollen veins that may develop: In the butt (rectum). These are called internal hemorrhoids. Around the opening of the butt (anus). These are called external hemorrhoids. Hemorrhoids can cause pain, itching, or bleeding. Most of the time, they do not cause serious problems. They usually get better with diet changes, lifestyle changes, and other home treatments. What are the causes? This condition may be caused by: Having trouble pooping (constipation). Pushing hard (straining) to poop. Watery poop (diarrhea). Pregnancy. Being very overweight (obese). Sitting for long periods of time. Heavy lifting or other activity that causes you to strain. Anal sex. Riding a bike for a long period of time. What are the signs or symptoms? Symptoms of this condition include: Pain. Itching or soreness in the butt. Bleeding from the butt. Leaking poop. Swelling in the area. One or more lumps around the opening of your butt. How is this diagnosed? A doctor can often diagnose this condition by looking at the affected area. The doctor may also: Do an exam that involves feeling the area with a gloved hand (digital rectal exam). Examine the area inside your butt using a small tube (anoscope). Order blood tests. This may be done if you have lost a lot of blood. Have you get a test that involves looking inside the colon using a flexible tube with a camera on the end (sigmoidoscopy or colonoscopy). How is this treated? This condition can usually be treated at home. Your doctor may tell you to change what you eat, make lifestyle changes, or try home treatments. If these do not help, procedures can be done to remove the hemorrhoids or make them smaller. These may involve: Placing rubber bands at the base of the hemorrhoids to cut off their blood supply. Injecting medicine into the hemorrhoids to shrink  them. Shining a type of light energy onto the hemorrhoids to cause them to fall off. Doing surgery to remove the hemorrhoids or cut off their blood supply. Follow these instructions at home: Eating and drinking  Eat foods that have a lot of fiber in them. These include whole grains, beans, nuts, fruits, and vegetables. Ask your doctor about taking products that have added fiber (fibersupplements). Reduce the amount of fat in your diet. You can do this by: Eating low-fat dairy products. Eating less red meat. Avoiding processed foods. Drink enough fluid to keep your pee (urine) pale yellow. Managing pain and swelling  Take a warm-water bath (sitz bath) for 20 minutes to ease pain. Do this 3-4 times a day. You may do this in a bathtub or using a portable sitz bath that fits over the toilet. If told, put ice on the painful area. It may be helpful to use ice between your warm baths. Put ice in a plastic bag. Place a towel between your skin and the bag. Leave the ice on for 20 minutes, 2-3 times a day. General instructions Take over-the-counter and prescription medicines only as told by your doctor. Medicated creams and medicines may be used as told. Exercise often. Ask your doctor how much and what kind of exercise is best for you. Go to the bathroom when you have the urge to poop. Do not wait. Avoid pushing too hard when you poop. Keep your butt dry and clean. Use wet toilet paper or moist towelettes after pooping. Do not sit on the toilet for a long time. Keep all follow-up visits  as told by your doctor. This is important. Contact a doctor if you: Have pain and swelling that do not get better with treatment or medicine. Have trouble pooping. Cannot poop. Have pain or swelling outside the area of the hemorrhoids. Get help right away if you have: Bleeding that will not stop. Summary Hemorrhoids are swollen veins in the butt or around the opening of the butt. They can cause pain,  itching, or bleeding. Eat foods that have a lot of fiber in them. These include whole grains, beans, nuts, fruits, and vegetables. Take a warm-water bath (sitz bath) for 20 minutes to ease pain. Do this 3-4 times a day. This information is not intended to replace advice given to you by your health care provider. Make sure you discuss any questions you have with your health care provider. Document Revised: 12/15/2020 Document Reviewed: 12/16/2020 Elsevier Patient Education  [place].

## 2022-04-14 NOTE — Progress Notes (Signed)
Outpatient Surgical Follow Up  04/14/2022  Kevin Crawford is an 61 y.o. male.   Chief Complaint  Patient presents with   New Patient (Initial Visit)    Thrombosed hemorrhoids    HPI: Annual is a 61 year old male well-known to me with a recent episode of acute thrombosed external hemorrhoids managed medically.  He is doing well.  He is hematochezia have subsided and his pain is improved dramatically.  No fevers no chills he is taking p.o.  The pain is intermittent now is mild and sharp.  He is very happy that we were able to manage this medically  Past Medical History:  Diagnosis Date   Actinic keratosis    Colon polyp    Duodenal ulcer    H/O colonoscopy 2019   Heart disease    Heart murmur    High cholesterol    Hypertension     Past Surgical History:  Procedure Laterality Date   CARDIAC CATHETERIZATION     COLONOSCOPY WITH PROPOFOL N/A 06/26/2019   Procedure: COLONOSCOPY WITH PROPOFOL;  Surgeon: Toledo, Benay Pike, MD;  Location: [facility];  Service: Gastroenterology;  Laterality: N/A;   ESOPHAGOGASTRODUODENOSCOPY (EGD) WITH PROPOFOL N/A 06/26/2019   Procedure: ESOPHAGOGASTRODUODENOSCOPY (EGD) WITH PROPOFOL;  Surgeon: Toledo, Benay Pike, MD;  Location: [facility];  Service: Gastroenterology;  Laterality: N/A;   INTRAVASCULAR PRESSURE WIRE/FFR STUDY N/A 09/28/2017   Procedure: INTRAVASCULAR PRESSURE WIRE/FFR STUDY;  Surgeon: Isaias Cowman, MD;  Location: [place] [facility];  Service: Cardiovascular;  Laterality: N/A;   LEFT HEART CATH AND CORONARY ANGIOGRAPHY Left 09/28/2017   Procedure: LEFT HEART CATH AND CORONARY ANGIOGRAPHY;  Surgeon: Isaias Cowman, MD;  Location: [place] [facility];  Service: Cardiovascular;  Laterality: Left;   SHOULDER ARTHROSCOPY W/ ROTATOR CUFF REPAIR Right    XI ROBOTIC LAPAROSCOPIC ASSISTED APPENDECTOMY N/A 03/01/2021   Procedure: XI ROBOTIC LAPAROSCOPIC ASSISTED APPENDECTOMY;  Surgeon: Herbert Pun, MD;  Location: [facility]  [facility];  Service: General;  Laterality: N/A;    Family History  Problem Relation Age of Onset   Diabetes Mother        TYPE 2   Hypertension Mother    Hyperlipidemia Mother    Hypertension Father    Hyperlipidemia Father    Lung cancer Maternal Grandmother    Liver cancer Maternal Grandmother    Liver cancer Maternal Grandfather    Pancreatic cancer Paternal Grandmother    Pancreatic cancer Paternal Grandfather     Social History:  reports that he has quit smoking. He quit smokeless tobacco use about 18 years ago.  His smokeless tobacco use included snuff and chew. He reports current alcohol use. He reports that he does not use drugs.  Allergies:  Allergies  Allergen Reactions   Phenergan [Promethazine Hcl] Other (See Comments)    SEIZURES   Mobic [Meloxicam] Other (See Comments)    Severe cramping    Medications reviewed.    ROS Full ROS performed and is otherwise negative other than what is stated in HPI   BP 134/82   Pulse 78   Temp 98.1 F (36.7 C) (Oral)   Ht '5\' 9"'$  (1.753 m)   Wt 208 lb 6.4 oz (94.5 kg)   SpO2 97%   BMI 30.78 kg/m   Physical Exam Chaperone present CONSTITUTIONAL: NAD. EYES: Pupils are equal, round, and reactive to light, Sclera are non-icteric. EARS, NOSE, MOUTH AND THROAT: The oropharynx is clear. The oral mucosa is pink and moist. Hearing is intact to voice. LYMPH NODES:  Lymph nodes in the neck are normal. RESPIRATORY:  Lungs are clear. There is normal respiratory effort, with equal breath sounds bilaterally, and without pathologic use of accessory muscles. CARDIOVASCULAR: Heart is regular without murmurs, gallops, or rubs. GI: The abdomen is  soft, nontender, and nondistended. There are no palpable masses. There is no hepatosplenomegaly. There are normal bowel sounds in all quadrants. Rectal :  There is evidence of a left lateral thrombosed external hemorrhoid with significant improvement as compared to a few days ago..  The exam is  limited due to  pain.  There is no evidence of perianal sepsis.   MUSCULOSKELETAL: Normal muscle strength and tone. No cyanosis or edema.   SKIN: Turgor is good and there are no pathologic skin lesions or ulcers. NEUROLOGIC: Motor and sensation is grossly normal. Cranial nerves are grossly intact. PSYCH:  Oriented to person, place and time. Affect is normal.      Assessment/Plan: 61 year old male with recent thrombosed external hemorrhoid manage medical therapy with good response.  At this time no evidence or need for surgical intervention.  Discussed with him in detail about continuation of appropriate care to include sitz bath's, high-fiber.  I will see him back in a couple months.  Please note that I spent 30 minutes in this encounter including personally reviewing medical records, imaging studies, placing orders, counseling the patient and performing appropriate documentation  Caroleen Hamman, MD [place] Surgeon

## 2022-04-18 ENCOUNTER — Encounter (INDEPENDENT_AMBULATORY_CARE_PROVIDER_SITE_OTHER): Payer: Self-pay

## 2022-07-04 DIAGNOSIS — K760 Fatty (change of) liver, not elsewhere classified: Secondary | ICD-10-CM | POA: Diagnosis not present

## 2022-07-04 DIAGNOSIS — K219 Gastro-esophageal reflux disease without esophagitis: Secondary | ICD-10-CM | POA: Diagnosis not present

## 2022-07-13 ENCOUNTER — Ambulatory Visit: Payer: 59 | Admitting: Surgery

## 2023-01-25 ENCOUNTER — Encounter: Payer: Self-pay | Admitting: Gastroenterology

## 2023-02-02 ENCOUNTER — Encounter: Payer: Self-pay | Admitting: Gastroenterology

## 2023-02-02 NOTE — H&P (Signed)
Pre-Procedure H&P   Patient ID: Kevin Crawford is a 62 y.o. male.  Gastroenterology Provider: [name], DO  Referring Provider: Tawni Pummel, PA PCP: Lauro Regulus, MD  Date: 02/03/2023  HPI Mr. Kevin Crawford is a 62 y.o. male who presents today for Esophagogastroduodenoscopy and Colonoscopy for GERD; bright red blood per rectum .  Patient noted large episode of bright red blood per rectum in July.  He had no pain with this episode.  Bowel movements have returned to normal with stool softener.  He denies any further hematochezia or melena. Overall has reflux controlled with otc lansoprazole and pepcid.  No dysphagia odynophagia nausea or vomiting.  Last underwent EGD and colonoscopy in January 2021 demonstrating gastritis negative for H. pylori, but otherwise both exams were within normal limits.  He does have a personal history of colon polyps and duodenal ulcer No family history of colon cancer or colon polyps Hemoglobin 15 MCV 94 platelets 290,000 creatinine 0.9 ALT 44 AST 26 Status post appendectomy   Past Medical History:  Diagnosis Date   Actinic keratosis    Colon polyp    Duodenal ulcer    H/O colonoscopy 2019   Heart disease    Heart murmur    High cholesterol    Hypertension     Past Surgical History:  Procedure Laterality Date   APPENDECTOMY     CARDIAC CATHETERIZATION     COLONOSCOPY WITH PROPOFOL N/A 06/26/2019   Procedure: COLONOSCOPY WITH PROPOFOL;  Surgeon: Toledo, Boykin Nearing, MD;  Location: [facility] ENDOSCOPY;  Service: Gastroenterology;  Laterality: N/A;   CORONARY PRESSURE/FFR STUDY N/A 09/28/2017   Procedure: INTRAVASCULAR PRESSURE WIRE/FFR STUDY;  Surgeon: Marcina Millard, MD;  Location: [facility]IVE [facility];  Service: Cardiovascular;  Laterality: N/A;   ESOPHAGOGASTRODUODENOSCOPY (EGD) WITH PROPOFOL N/A 06/26/2019   Procedure: ESOPHAGOGASTRODUODENOSCOPY (EGD) WITH PROPOFOL;  Surgeon: Toledo, Boykin Nearing, MD;  Location:  [facility] ENDOSCOPY;  Service: Gastroenterology;  Laterality: N/A;   LEFT HEART CATH AND CORONARY ANGIOGRAPHY Left 09/28/2017   Procedure: LEFT HEART CATH AND CORONARY ANGIOGRAPHY;  Surgeon: Marcina Millard, MD;  Location: [facility] IN[facility] CV [facility];  Service: Cardiovascular;  Laterality: Left;   SHOULDER ARTHROSCOPY W/ ROTATOR CUFF REPAIR Right    XI ROBOTIC LAPAROSCOPIC ASSISTED APPENDECTOMY N/A 03/01/2021   Procedure: XI ROBOTIC LAPAROSCOPIC ASSISTED APPENDECTOMY;  Surgeon: Carolan Shiver, MD;  Location: [facility] ORS;  Service: General;  Laterality: N/A;    Family History Grandparents with pancreatic and liver cancers No other h/o GI disease or malignancy  Review of Systems  Constitutional:  Negative for activity change, appetite change, chills, diaphoresis, fatigue, fever and unexpected weight change.  HENT:  Negative for trouble swallowing and voice change.   Respiratory:  Negative for shortness of breath and wheezing.   Cardiovascular:  Negative for chest pain, palpitations and leg swelling.  Gastrointestinal:  Positive for blood in stool. Negative for abdominal distention, abdominal pain, anal bleeding, constipation, diarrhea, nausea and vomiting.  Musculoskeletal:  Negative for arthralgias and myalgias.  Skin:  Negative for color change and pallor.  Neurological:  Negative for dizziness, syncope and weakness.  Psychiatric/Behavioral:  Negative for confusion. The patient is not nervous/anxious.   All other systems reviewed and are negative.    Medications No current facility-administered medications on file prior to encounter.   Current Outpatient Medications on File Prior to Encounter  Medication Sig Dispense Refill   amLODipine-benazepril (LOTREL) 5-20 MG capsule Take 1 capsule by mouth once daily 90 capsule 4  aspirin 81 MG EC tablet Take 81 mg by mouth daily. Swallow whole.     atorvastatin (LIPITOR) 10 MG tablet Take 1 tablet by mouth once daily 90 tablet 2   Multiple  Vitamin (MULTIVITAMIN) tablet Take 1 tablet by mouth daily.     Omega-3 Fatty Acids (FISH OIL) 1000 MG CAPS Take 1,500 mg by mouth daily.     Lidocaine 0.5 % GEL Apply 1 Application topically daily as needed. 170 g 2   tadalafil (CIALIS) 10 MG tablet Take 1 tablet (10 mg total) by mouth daily as needed. 30 tablet 0    Pertinent medications related to GI and procedure were reviewed by me with the patient prior to the procedure   Current Facility-Administered Medications:    0.9 %  sodium chloride infusion, , Intravenous, Continuous, [name], DO, Last Rate: 20 mL/hr at 02/03/23 0834, New Bag at 02/03/23 0834  sodium chloride 20 mL/hr at 02/03/23 1610       Allergies  Allergen Reactions   Phenergan [Promethazine Hcl] Other (See Comments)    SEIZURES   Mobic [Meloxicam] Other (See Comments)    Severe cramping   Allergies were reviewed by me prior to the procedure  Objective   Body mass index is 30.27 kg/m. Vitals:   02/03/23 0819  BP: 123/78  Pulse: 79  Resp: 17  Temp: 97.6 F (36.4 C)  TempSrc: Temporal  SpO2: 98%  Weight: 93 kg  Height: 5\' 9"  (1.753 m)     Physical Exam Vitals and nursing note reviewed.  Constitutional:      General: He is not in acute distress.    Appearance: Normal appearance. He is not ill-appearing, toxic-appearing or diaphoretic.  HENT:     Head: Normocephalic and atraumatic.     Nose: Nose normal.     Mouth/Throat:     Mouth: Mucous membranes are moist.     Pharynx: Oropharynx is clear.  Eyes:     General: No scleral icterus.    Extraocular Movements: Extraocular movements intact.  Cardiovascular:     Rate and Rhythm: Normal rate and regular rhythm.     Heart sounds: Normal heart sounds. No murmur heard.    No friction rub. No gallop.  Pulmonary:     Effort: Pulmonary effort is normal. No respiratory distress.     Breath sounds: Normal breath sounds. No wheezing, rhonchi or rales.  Abdominal:     General: Bowel sounds  are normal. There is no distension.     Palpations: Abdomen is soft.     Tenderness: There is no abdominal tenderness. There is no guarding or rebound.  Musculoskeletal:     Cervical back: Neck supple.     Right lower leg: No edema.     Left lower leg: No edema.  Skin:    General: Skin is warm and dry.     Coloration: Skin is not jaundiced or pale.  Neurological:     General: No focal deficit present.     Mental Status: He is alert and oriented to person, place, and time. Mental status is at baseline.  Psychiatric:        Mood and Affect: Mood normal.        Behavior: Behavior normal.        Thought Content: Thought content normal.        Judgment: Judgment normal.      Assessment:  Mr. OCTAVE WOODROME is a 62 y.o. male  who presents today  for Esophagogastroduodenoscopy and Colonoscopy for GERD; bright red blood per rectum .  Plan:  Esophagogastroduodenoscopy and Colonoscopy with possible intervention today  Esophagogastroduodenoscopy and Colonoscopy with possible biopsy, control of bleeding, polypectomy, and interventions as necessary has been discussed with the patient/patient representative. Informed consent was obtained from the patient/patient representative after explaining the indication, nature, and risks of the procedure including but not limited to death, bleeding, perforation, missed neoplasm/lesions, cardiorespiratory compromise, and reaction to medications. Opportunity for questions was given and appropriate answers were provided. Patient/patient representative has verbalized understanding is amenable to undergoing the procedure.   [name], DO  [facility]astroenter[facility]  Portions of the record may have been created with voice recognition software. Occasional wrong-word or 'sound-a-like' substitutions may have occurred due to the inherent limitations of voice recognition software.  Read the chart carefully and recognize, using context, where  substitutions may have occurred.

## 2023-02-03 ENCOUNTER — Ambulatory Visit: Payer: 59 | Admitting: Anesthesiology

## 2023-02-03 ENCOUNTER — Ambulatory Visit
Admission: RE | Admit: 2023-02-03 | Discharge: 2023-02-03 | Disposition: A | Discharge status: Home / Self Care | Payer: 59 | Attending: Gastroenterology | Admitting: Gastroenterology

## 2023-02-03 ENCOUNTER — Encounter
Admission: RE | Disposition: A | Discharge status: Home / Self Care | Payer: Self-pay | Source: Home / Self Care | Attending: Gastroenterology

## 2023-02-03 ENCOUNTER — Encounter: Payer: Self-pay | Admitting: Gastroenterology

## 2023-02-03 DIAGNOSIS — D122 Benign neoplasm of ascending colon: Secondary | ICD-10-CM | POA: Insufficient documentation

## 2023-02-03 DIAGNOSIS — K219 Gastro-esophageal reflux disease without esophagitis: Secondary | ICD-10-CM | POA: Diagnosis not present

## 2023-02-03 DIAGNOSIS — D128 Benign neoplasm of rectum: Secondary | ICD-10-CM | POA: Diagnosis not present

## 2023-02-03 DIAGNOSIS — K644 Residual hemorrhoidal skin tags: Secondary | ICD-10-CM | POA: Insufficient documentation

## 2023-02-03 DIAGNOSIS — Z86010 Personal history of colonic polyps: Secondary | ICD-10-CM | POA: Insufficient documentation

## 2023-02-03 DIAGNOSIS — D123 Benign neoplasm of transverse colon: Secondary | ICD-10-CM | POA: Insufficient documentation

## 2023-02-03 DIAGNOSIS — Z79899 Other long term (current) drug therapy: Secondary | ICD-10-CM | POA: Diagnosis not present

## 2023-02-03 DIAGNOSIS — K648 Other hemorrhoids: Secondary | ICD-10-CM | POA: Insufficient documentation

## 2023-02-03 DIAGNOSIS — Z8711 Personal history of peptic ulcer disease: Secondary | ICD-10-CM | POA: Insufficient documentation

## 2023-02-03 DIAGNOSIS — K3189 Other diseases of stomach and duodenum: Secondary | ICD-10-CM | POA: Diagnosis not present

## 2023-02-03 DIAGNOSIS — K2960 Other gastritis without bleeding: Secondary | ICD-10-CM | POA: Diagnosis not present

## 2023-02-03 DIAGNOSIS — K625 Hemorrhage of anus and rectum: Secondary | ICD-10-CM | POA: Diagnosis present

## 2023-02-03 HISTORY — PX: POLYPECTOMY: SHX5525

## 2023-02-03 HISTORY — PX: BIOPSY: SHX5522

## 2023-02-03 HISTORY — PX: COLONOSCOPY WITH PROPOFOL: SHX5780

## 2023-02-03 HISTORY — PX: ESOPHAGOGASTRODUODENOSCOPY: SHX5428

## 2023-02-03 SURGERY — COLONOSCOPY WITH PROPOFOL
Anesthesia: General

## 2023-02-03 SURGERY — ESOPHAGOGASTRODUODENOSCOPY (EGD) WITH PROPOFOL
Anesthesia: General

## 2023-02-03 MED ORDER — LIDOCAINE HCL (CARDIAC) PF 100 MG/5ML IV SOSY
PREFILLED_SYRINGE | INTRAVENOUS | Status: DC | PRN
  Administered 2023-02-03 (×2): 100 mg via INTRAVENOUS

## 2023-02-03 MED ORDER — PROPOFOL 10 MG/ML IV BOLUS
INTRAVENOUS | Status: DC | PRN
  Administered 2023-02-03: 50 mg via INTRAVENOUS
  Administered 2023-02-03: 150 ug/kg/min via INTRAVENOUS

## 2023-02-03 MED ORDER — SODIUM CHLORIDE 0.9 % IV SOLN: INTRAVENOUS | Status: DC

## 2023-02-03 NOTE — Op Note (Signed)
[facility] Gastroenterology Patient Name: Kevin Crawford Procedure Date: 02/03/2023 9:37 AM MRN: [id number] Account #: [id number] Date of Birth: Mar 03, 1961 Admit Type: Outpatient Age: 62 Room: [facility] ENDO ROOM 1 Gender: Male Note Status: Supervisor Override Instrument Name: Prentice Docker [id number] Procedure:             Colonoscopy Indications:           Rectal bleeding Providers:             [name] DO, DO Medicines:             Monitored Anesthesia Care Complications:         No immediate complications. Estimated blood loss:                         Minimal. Procedure:             Pre-Anesthesia Assessment:                        - Prior to the procedure, a History and Physical was                         performed, and patient medications and allergies were                         reviewed. The patient is competent. The risks and                         benefits of the procedure and the sedation options and                         risks were discussed with the patient. All questions                         were answered and informed consent was obtained.                         Patient identification and proposed procedure were                         verified by the physician, the nurse, the anesthetist                         and the technician in the endoscopy suite. Mental                         Status Examination: alert and oriented. Airway                         Examination: normal oropharyngeal airway and neck                         mobility. Respiratory Examination: clear to                         auscultation. CV Examination: RRR, no murmurs, no S3                         or S4. Prophylactic Antibiotics: The patient does not  require prophylactic antibiotics. Prior                         Anticoagulants: The patient has taken no anticoagulant                         or antiplatelet agents. ASA Grade Assessment: II - A                          patient with mild systemic disease. After reviewing                         the risks and benefits, the patient was deemed in                         satisfactory condition to undergo the procedure. The                         anesthesia plan was to use monitored anesthesia care                         (MAC). Immediately prior to administration of                         medications, the patient was re-assessed for adequacy                         to receive sedatives. The heart rate, respiratory                         rate, oxygen saturations, blood pressure, adequacy of                         pulmonary ventilation, and response to care were                         monitored throughout the procedure. The physical                         status of the patient was re-assessed after the                         procedure.                        After obtaining informed consent, the colonoscope was                         passed under direct vision. Throughout the procedure,                         the patient's blood pressure, pulse, and oxygen                         saturations were monitored continuously. The                         Colonoscope was introduced through the anus and  advanced to the the terminal ileum, with                         identification of the appendiceal orifice and IC                         valve. The colonoscopy was performed without                         difficulty. The patient tolerated the procedure well.                         The quality of the bowel preparation was evaluated                         using the BBPS [facility] Bowel Preparation Scale) with                         scores of: Right Colon = 3, Transverse Colon = 3 and                         Left Colon = 3 (entire mucosa seen well with no                         residual staining, small fragments of stool or opaque                         liquid).  The total BBPS score equals 9. The terminal                         ileum, ileocecal valve, appendiceal orifice, and                         rectum were photographed. Findings:      Hemorrhoids were found on perianal exam.      The digital rectal exam was normal. Pertinent negatives include normal       sphincter tone.      The terminal ileum appeared normal. Estimated blood loss: none.      Retroflexion in the right colon was performed.      Non-bleeding external and internal hemorrhoids were found during       retroflexion and during perianal exam.      Three sessile polyps were found in the rectum (1) and ascending colon       (2). The polyps were 1 to 2 mm in size. These polyps were removed with a       jumbo cold forceps. Resection and retrieval were complete. Estimated       blood loss was minimal.      A 3 to 4 mm polyp was found in the transverse colon. The polyp was       sessile. The polyp was removed with a cold snare. Resection and       retrieval were complete. Estimated blood loss was minimal.      The exam was otherwise without abnormality on direct and retroflexion       views. Impression:            - Hemorrhoids found on perianal exam.                        -  The examined portion of the ileum was normal.                        - Non-bleeding external and internal hemorrhoids.                        - Three 1 to 2 mm polyps in the rectum and in the                         ascending colon, removed with a jumbo cold forceps.                         Resected and retrieved.                        - One 3 to 4 mm polyp in the transverse colon, removed                         with a cold snare. Resected and retrieved.                        - The examination was otherwise normal on direct and                         retroflexion views. Recommendation:        - Patient has a contact number available for                         emergencies. The signs and symptoms of potential                          delayed complications were discussed with the patient.                         Return to normal activities tomorrow. Written                         discharge instructions were provided to the patient.                        - Discharge patient to home.                        - Resume previous diet.                        - Continue present medications.                        - Await pathology results.                        - Repeat colonoscopy for surveillance based on                         pathology results.                        - Return to referring physician as previously  scheduled.                        - The findings and recommendations were discussed with                         the patient. Procedure Code(s):     --- Professional ---                        [phone], Colonoscopy, flexible; with removal of                         tumor(s), polyp(s), or other lesion(s) by snare                         technique                        45380, 59, Colonoscopy, flexible; with biopsy, single                         or multiple Diagnosis Code(s):     --- Professional ---                        K64.8, Other hemorrhoids                        D12.8, Benign neoplasm of rectum                        D12.2, Benign neoplasm of ascending colon                        D12.3, Benign neoplasm of transverse colon (hepatic                         flexure or splenic flexure)                        K92.1, Melena (includes Hematochezia) CPT copyright 2022 American Medical Association. All rights reserved. The codes documented in this report are preliminary and upon coder review may  be revised to meet current compliance requirements. Attending Participation:      I personally performed the entire procedure. Elfredia Nevins, DO [name] DO, DO 02/03/2023 10:26:23 AM This report has been signed electronically. Number of Addenda: 0 Note  Initiated On: 02/03/2023 9:37 AM Scope Withdrawal Time: 0 hours 17 minutes 2 seconds  Total Procedure Duration: 0 hours 21 minutes 55 seconds  Estimated Blood Loss:  Estimated blood loss was minimal.      [facility]

## 2023-02-03 NOTE — Anesthesia Postprocedure Evaluation (Signed)
Anesthesia Post Note  Patient: Kevin Crawford  Procedure(s) Performed: COLONOSCOPY WITH PROPOFOL ESOPHAGOGASTRODUODENOSCOPY (EGD) BIOPSY POLYPECTOMY  Patient location during evaluation: PACU Anesthesia Type: General Level of consciousness: awake and alert, oriented and patient cooperative Pain management: pain level controlled Vital Signs Assessment: post-procedure vital signs reviewed and stable Respiratory status: spontaneous breathing, nonlabored ventilation and respiratory function stable Cardiovascular status: blood pressure returned to baseline and stable Postop Assessment: adequate PO intake Anesthetic complications: no   There were no known notable events for this encounter.   Last Vitals:  Vitals:   02/03/23 1025 02/03/23 1030  BP: 116/70   Pulse:    Resp:    Temp:  (!) 35.6 C  SpO2:      Last Pain:  Vitals:   02/03/23 1105  TempSrc:   PainSc: 0-No pain                 Reed Breech

## 2023-02-03 NOTE — Anesthesia Preprocedure Evaluation (Addendum)
Anesthesia Evaluation  Patient identified by MRN, date of birth, ID band Patient awake    Reviewed: Allergy & Precautions, NPO status , Patient's Chart, lab work & pertinent test results  History of Anesthesia Complications Negative for: history of anesthetic complications  Airway Mallampati: II   Neck ROM: Full    Dental no notable dental hx.    Pulmonary former smoker (quit 2005)   Pulmonary exam normal breath sounds clear to auscultation       Cardiovascular hypertension, + CAD  Normal cardiovascular exam Rhythm:Regular Rate:Normal  Myocardial perfusion 02/28/22:  1.  Normal left ventricular function  2.  Normal wall motion  3.  Mild inferior scar without evidence for ischemia     Neuro/Psych negative neurological ROS     GI/Hepatic PUD,GERD  ,,  Endo/Other  Obesity   Renal/GU negative Renal ROS     Musculoskeletal   Abdominal   Peds  Hematology negative hematology ROS (+)   Anesthesia Other Findings Cardiology note 01/20/23:  62 y.o. male with  1. S/P cardiac catheterization  2. Coronary artery disease involving native coronary artery of native heart without angina pectoris  3. Essential hypertension  4. Pure hypercholesterolemia   62 year old gentleman with known coronary artery disease with a 49-month history of decreased stamina and easy fatigability. He also reported intermittent dizziness. Previous Lexiscan Myoview in 2019 study revealed mild inferior wall ischemia. Cardiac CTA revealed 50% stenosis proximal LAD which was significant with CT FFR 0.74. Cardiac catheterization 09/2017 did reveal 50% stenosis proximal LAD, with negative FFR. Patient underwent ETT Myoview 08/06/2020 which revealed mild inferior scar versus artifact without evidence for significant ischemia. The patient has essential hypertension, blood pressure well controlled on current BP medications. Patient has hyperlipidemia, with excellent  control of LDL cholesterol on atorvastatin. Patient underwent scan Myoview 02/28/2022 which revealed LV ejection fraction 60%, with mild inferior scar versus artifact, without evidence for ischemia, as required for commercial driving license.  Plan   1. Continue current medications 2. Counseled patient about low-sodium diet 3. DASH diet printed instructions given to the patient 4. Counseled patient about low-cholesterol diet 5. Continue atorvastatin for hyperlipidemia management  6. Heart healthy diet printed instructions given to the patient 7. Encouraged patient to exercise and lose weight 8. Return to clinic for follow-up in 6 months  No orders of the defined types were placed in this encounter.  Return in about 6 months (around 07/23/2023).    Reproductive/Obstetrics                             Anesthesia Physical Anesthesia Plan  ASA: 2  Anesthesia Plan: General   Post-op Pain Management:    Induction: Intravenous  PONV Risk Score and Plan: 2 and Propofol infusion, TIVA and Treatment may vary due to age or medical condition  Airway Management Planned: Natural Airway  Additional Equipment:   Intra-op Plan:   Post-operative Plan:   Informed Consent: I have reviewed the patients History and Physical, chart, labs and discussed the procedure including the risks, benefits and alternatives for the proposed anesthesia with the patient or authorized representative who has indicated his/her understanding and acceptance.       Plan Discussed with: CRNA  Anesthesia Plan Comments: (LMA/GETA backup discussed.  Patient consented for risks of anesthesia including but not limited to:  - adverse reactions to medications - damage to eyes, teeth, lips or other oral mucosa - nerve damage due to  positioning  - sore throat or hoarseness - damage to heart, brain, nerves, lungs, other parts of body or loss of life  Informed patient about role of CRNA in peri-  and intra-operative care.  Patient voiced understanding.)        Anesthesia Quick Evaluation

## 2023-02-03 NOTE — Interval H&P Note (Signed)
History and Physical Interval Note: Preprocedure H&P from 02/03/23  was reviewed and there was no interval change after seeing and examining the patient.  Written consent was obtained from the patient after discussion of risks, benefits, and alternatives. Patient has consented to proceed with Esophagogastroduodenoscopy and Colonoscopy with possible intervention   02/03/2023 9:38 AM  Kevin Crawford  has presented today for surgery, with the diagnosis of 569.3 (ICD-9-CM) - K62.5 (ICD-10-CM) - Rectal bleeding530.81 (ICD-9-CM) - K21.9 (ICD-10-CM) - Gastroesophageal reflux disease, unspecified whether esophagitis present.  The various methods of treatment have been discussed with the patient and family. After consideration of risks, benefits and other options for treatment, the patient has consented to  Procedure(s): COLONOSCOPY WITH PROPOFOL (N/A) ESOPHAGOGASTRODUODENOSCOPY (EGD) (N/A) as a surgical intervention.  The patient's history has been reviewed, patient examined, no change in status, stable for surgery.  I have reviewed the patient's chart and labs.  Questions were answered to the patient's satisfaction.     [name]

## 2023-02-03 NOTE — Transfer of Care (Signed)
Immediate Anesthesia Transfer of Care Note  Patient: NICOLAE DANN  Procedure(s) Performed: COLONOSCOPY WITH PROPOFOL ESOPHAGOGASTRODUODENOSCOPY (EGD) BIOPSY POLYPECTOMY  Patient Location: PACU  Anesthesia Type:General  Level of Consciousness: awake, alert , and oriented  Airway & Oxygen Therapy: Patient Spontanous Breathing  Post-op Assessment: Report given to RN and Post -op Vital signs reviewed and stable  Post vital signs: stable  Last Vitals:  Vitals Value Taken Time  BP    Temp    Pulse 63 02/03/23 1024  Resp 17 02/03/23 1024  SpO2 98 % 02/03/23 1024  Vitals shown include unfiled device data.  Last Pain:  Vitals:   02/03/23 0819  TempSrc: Temporal  PainSc: 0-No pain         Complications: No notable events documented.

## 2023-02-03 NOTE — Op Note (Signed)
[facility] Patient Name: Kevin Crawford Procedure Date: 02/03/2023 9:37 AM MRN: [id number] Account #: [id number] Date of Birth: [birth year] Admit Type: Outpatient Age: 62 Room: [facility] ENDO ROOM 1 Gender: Male Note Status: Supervisor Override Instrument Name: Upper Endoscope [id number] Procedure:             Upper GI endoscopy Indications:           Gastro-esophageal reflux disease Providers:             [name] DO, DO Medicines:             Monitored Anesthesia Care Complications:         No immediate complications. Estimated blood loss:                         Minimal. Procedure:             Pre-Anesthesia Assessment:                        - Prior to the procedure, a History and Physical was                         performed, and patient medications and allergies were                         reviewed. The patient is competent. The risks and                         benefits of the procedure and the sedation options and                         risks were discussed with the patient. All questions                         were answered and informed consent was obtained.                         Patient identification and proposed procedure were                         verified by the physician, the nurse, the anesthetist                         and the technician in the endoscopy suite. Mental                         Status Examination: alert and oriented. Airway                         Examination: normal oropharyngeal airway and neck                         mobility. Respiratory Examination: clear to                         auscultation. CV Examination: RRR, no murmurs, no S3                         or S4. Prophylactic Antibiotics: The patient  does not                         require prophylactic antibiotics. Prior                         Anticoagulants: The patient has taken no anticoagulant                         or antiplatelet agents.  ASA Grade Assessment: II - A                         patient with mild systemic disease. After reviewing                         the risks and benefits, the patient was deemed in                         satisfactory condition to undergo the procedure. The                         anesthesia plan was to use monitored anesthesia care                         (MAC). Immediately prior to administration of                         medications, the patient was re-assessed for adequacy                         to receive sedatives. The heart rate, respiratory                         rate, oxygen saturations, blood pressure, adequacy of                         pulmonary ventilation, and response to care were                         monitored throughout the procedure. The physical                         status of the patient was re-assessed after the                         procedure.                        After obtaining informed consent, the endoscope was                         passed under direct vision. Throughout the procedure,                         the patient's blood pressure, pulse, and oxygen                         saturations were monitored continuously. The Endoscope  was introduced through the mouth, and advanced to the                         second part of duodenum. The upper GI endoscopy was                         accomplished without difficulty. The patient tolerated                         the procedure well. Findings:      The duodenal bulb, first portion of the duodenum and second portion of       the duodenum were normal. Estimated blood loss: none.      A single localized 3 to 4 mm erosion with no bleeding and no stigmata of       recent bleeding was found in the gastric antrum. Biopsies were taken       with a cold forceps for histology. Estimated blood loss was minimal.      The exam of the stomach was otherwise normal.      The Z-line was regular.  Estimated blood loss: none.      Esophagogastric landmarks were identified: the gastroesophageal junction       was found at 38 cm from the incisors.      The exam of the esophagus was otherwise normal. Impression:            - Normal duodenal bulb, first portion of the duodenum                         and second portion of the duodenum.                        - Erosive gastropathy with no bleeding and no stigmata                         of recent bleeding. Biopsied.                        - Z-line regular.                        - Esophagogastric landmarks identified. Recommendation:        - Patient has a contact number available for                         emergencies. The signs and symptoms of potential                         delayed complications were discussed with the patient.                         Return to normal activities tomorrow. Written                         discharge instructions were provided to the patient.                        - Discharge patient to home.                        -  Resume previous diet.                        - Continue present medications.                        - Await pathology results.                        - Return to GI clinic as previously scheduled.                        - The findings and recommendations were discussed with                         the patient.                        - proceed with colonoscopy. please see report for                         further recommendations Procedure Code(s):     --- Professional ---                        [phone], Esophagogastroduodenoscopy, flexible,                         transoral; with biopsy, single or multiple Diagnosis Code(s):     --- Professional ---                        K31.89, Other diseases of stomach and duodenum                        R12, Heartburn CPT copyright 2022 [employer]. All rights reserved. The codes documented in this report are preliminary and upon  coder review may  be revised to meet current compliance requirements. Attending Participation:      I personally performed the entire procedure. Elfredia Nevins, DO [name] DO, DO 02/03/2023 9:56:20 AM This report has been signed electronically. Number of Addenda: 0 Note Initiated On: 02/03/2023 9:37 AM Estimated Blood Loss:  Estimated blood loss was minimal.      [facility]

## 2023-02-09 ENCOUNTER — Ambulatory Visit: Payer: 59 | Admitting: Dermatology

## 2023-02-09 ENCOUNTER — Encounter: Payer: 59 | Admitting: Dermatology

## 2023-02-09 VITALS — BP 127/85 | HR 70

## 2023-02-09 DIAGNOSIS — L814 Other melanin hyperpigmentation: Secondary | ICD-10-CM

## 2023-02-09 DIAGNOSIS — Z7189 Other specified counseling: Secondary | ICD-10-CM

## 2023-02-09 DIAGNOSIS — Z1283 Encounter for screening for malignant neoplasm of skin: Secondary | ICD-10-CM

## 2023-02-09 DIAGNOSIS — L578 Other skin changes due to chronic exposure to nonionizing radiation: Secondary | ICD-10-CM | POA: Diagnosis not present

## 2023-02-09 DIAGNOSIS — L719 Rosacea, unspecified: Secondary | ICD-10-CM

## 2023-02-09 DIAGNOSIS — L570 Actinic keratosis: Secondary | ICD-10-CM | POA: Diagnosis not present

## 2023-02-09 DIAGNOSIS — L853 Xerosis cutis: Secondary | ICD-10-CM

## 2023-02-09 DIAGNOSIS — L918 Other hypertrophic disorders of the skin: Secondary | ICD-10-CM

## 2023-02-09 NOTE — Progress Notes (Signed)
Follow-Up Visit   Subjective  Kevin Crawford is a 62 y.o. male who presents for the following: Skin Cancer Screening and Full Body Skin Exam  The patient presents for Total-Body Skin Exam (TBSE) for skin cancer screening and mole check. The patient has spots, moles and lesions to be evaluated, some may be new or changing and the patient may have concern these could be cancer.  The following portions of the chart were reviewed this encounter and updated as appropriate: medications, allergies, medical history  Review of Systems:  No other skin or systemic complaints except as noted in HPI or Assessment and Plan.  Objective  Well appearing patient in no apparent distress; mood and affect are within normal limits.  A full examination was performed including scalp, head, eyes, ears, nose, lips, neck, chest, axillae, abdomen, back, buttocks, bilateral upper extremities, bilateral lower extremities, hands, feet, fingers, toes, fingernails, and toenails. All findings within normal limits unless otherwise noted below.   Relevant physical exam findings are noted in the Assessment and Plan.  Scalp x 6 (6) Erythematous thin papules/macules with gritty scale.     Assessment & Plan   SKIN CANCER SCREENING PERFORMED TODAY.  ACTINIC DAMAGE - Chronic condition, secondary to cumulative UV/sun exposure - diffuse scaly erythematous macules with underlying dyspigmentation - Recommend daily broad spectrum sunscreen SPF 30+ to sun-exposed areas, reapply every 2 hours as needed.  - Staying in the shade or wearing long sleeves, sun glasses (UVA+UVB protection) and wide brim hats (4-inch brim around the entire circumference of the hat) are also recommended for sun protection.  - Call for new or changing lesions.  LENTIGINES, SEBORRHEIC KERATOSES, HEMANGIOMAS - Benign normal skin lesions - Benign-appearing - Call for any changes  MELANOCYTIC NEVI - Tan-brown and/or pink-flesh-colored symmetric  macules and papules - Benign appearing on exam today - Observation - Call clinic for new or changing moles - Recommend daily use of broad spectrum spf 30+ sunscreen to sun-exposed areas.   ROSACEA Exam Mid face erythema with telangiectasias +/-   Rosacea is a chronic progressive skin condition usually affecting the face of adults, causing redness and/or acne bumps. It is treatable but not curable. It sometimes affects the eyes (ocular rosacea) as well. It may respond to topical and/or systemic medication and can flare with stress, sun exposure, alcohol, exercise, topical steroids (including hydrocortisone/cortisone 10) and some foods.  Daily application of broad spectrum spf 30+ sunscreen to face is recommended to reduce flares.  Treatment Plan Mild no tx needed. Counseling for BBL / IPL / Laser and Coordination of Care Discussed the treatment option of Broad Band Light (BBL) /Intense Pulsed Light (IPL)/ Laser for skin discoloration, including brown spots and redness.  Typically we recommend at least 1-3 treatment sessions about 5-8 weeks apart for best results.  Cannot have tanned skin when BBL performed, and regular use of sunscreen/photoprotection is advised after the procedure to help maintain results. The patient's condition may also require "maintenance treatments" in the future.  The fee for BBL / laser treatments is $350 per treatment session for the whole face.  A fee can be quoted for other parts of the body.  Insurance typically does not pay for BBL/laser treatments and therefore the fee is an out-of-pocket cost. Recommend prophylactic valtrex treatment. Once scheduled for procedure, will send Rx in prior to patient's appointment.   AK (actinic keratosis) (6) Scalp x 6  Actinic keratoses are precancerous spots that appear secondary to cumulative UV radiation exposure/sun  exposure over time. They are chronic with expected duration over 1 year. A portion of actinic keratoses will progress  to squamous cell carcinoma of the skin. It is not possible to reliably predict which spots will progress to skin cancer and so treatment is recommended to prevent development of skin cancer.  Recommend daily broad spectrum sunscreen SPF 30+ to sun-exposed areas, reapply every 2 hours as needed.  Recommend staying in the shade or wearing long sleeves, sun glasses (UVA+UVB protection) and wide brim hats (4-inch brim around the entire circumference of the hat). Call for new or changing lesions.   Destruction of lesion - Scalp x 6 (6) Complexity: simple   Destruction method: cryotherapy   Informed consent: discussed and consent obtained   Timeout:  patient name, date of birth, surgical site, and procedure verified Lesion destroyed using liquid nitrogen: Yes   Region frozen until ice ball extended beyond lesion: Yes   Outcome: patient tolerated procedure well with no complications   Post-procedure details: wound care instructions given    Skin cancer screening  Actinic skin damage  Lentigo  Rosacea  Counseling and coordination of care  Xerosis cutis  Skin tags, multiple acquired  Acrochordons (Skin Tags) - Fleshy, skin-colored pedunculated papules - Benign appearing.  - Observe. - If desired, they can be removed with an in office procedure that is not covered by insurance. - Please call the clinic if you notice any new or changing lesions.  Nail dystrophy due to pressure or trauma - Benign-appearing.  Observation.  Call clinic for new or changing lesions.  Recommend daily use of broad spectrum spf 30+ sunscreen to sun-exposed areas.   Return in about 1 year (around 02/09/2024) for TBSE.  Maylene Roes, CMA, am acting as scribe for Armida Sans, MD .   Documentation: I have reviewed the above documentation for accuracy and completeness, and I agree with the above.  Armida Sans, MD

## 2023-02-09 NOTE — Patient Instructions (Signed)

## 2023-02-17 ENCOUNTER — Encounter: Payer: Self-pay | Admitting: Dermatology

## 2023-03-13 ENCOUNTER — Encounter: Payer: Self-pay | Admitting: Dermatology

## 2023-03-23 IMAGING — US US ABDOMEN LIMITED
1 series · 14 of 25 positions shown · non-contrast
Comparison: None.

CLINICAL DATA: Right upper quadrant pain for 6 months.

EXAM:
ULTRASOUND ABDOMEN LIMITED RIGHT UPPER QUADRANT

[Series 1: us abdomen limited ruq (liver/gb) · 14 of 40 slices shown]
[im 1/40]
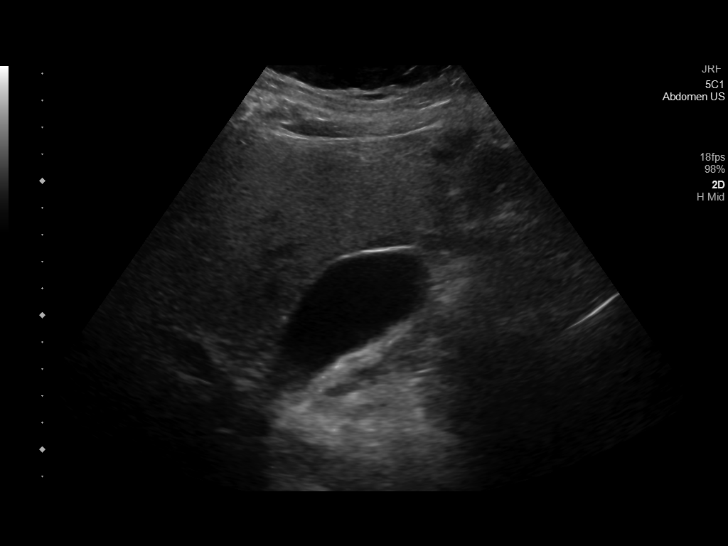
[im 4/40]
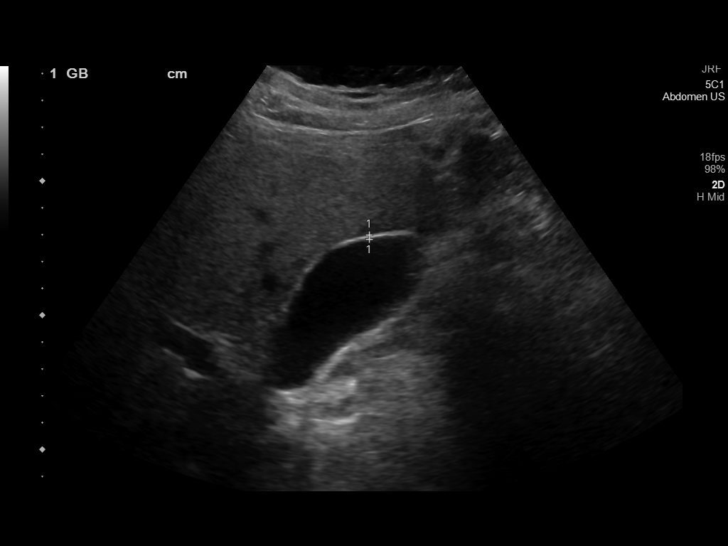
[im 7/40]
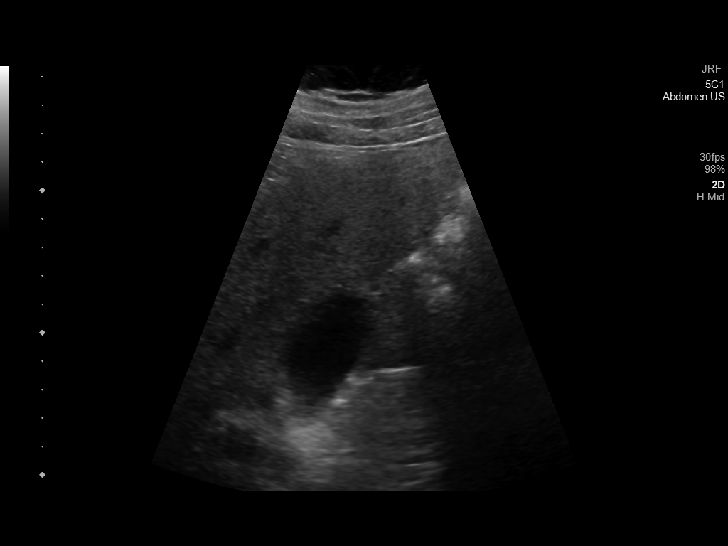
[im 10/40]
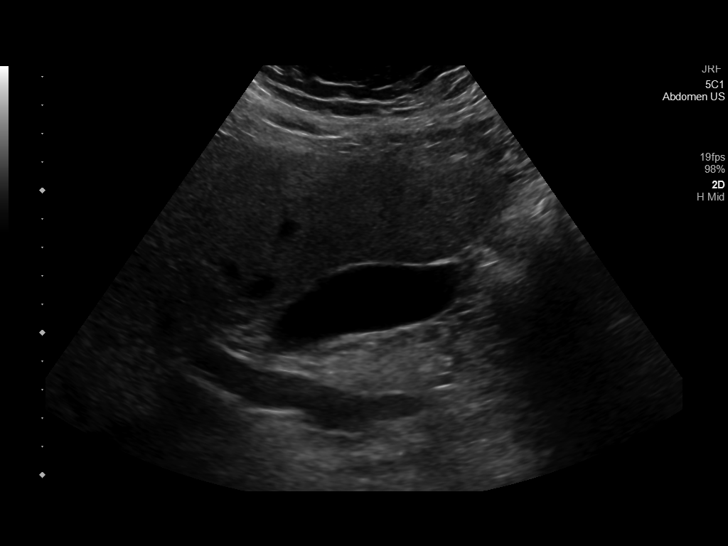
[im 14/40]
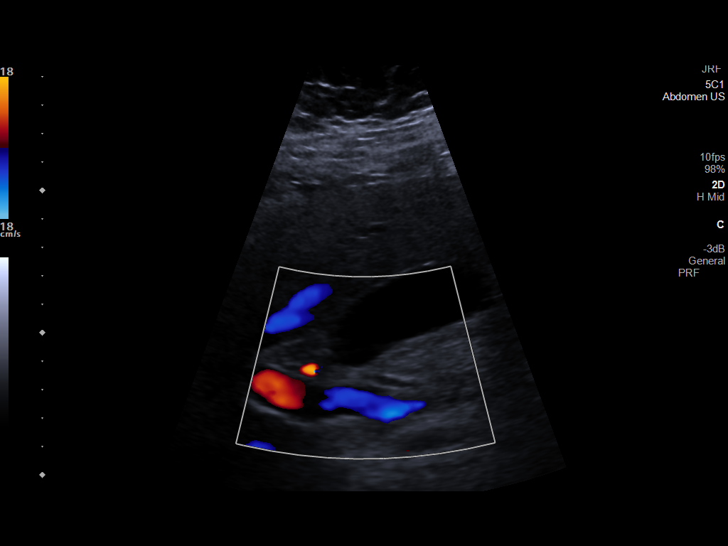
[im 15/40]
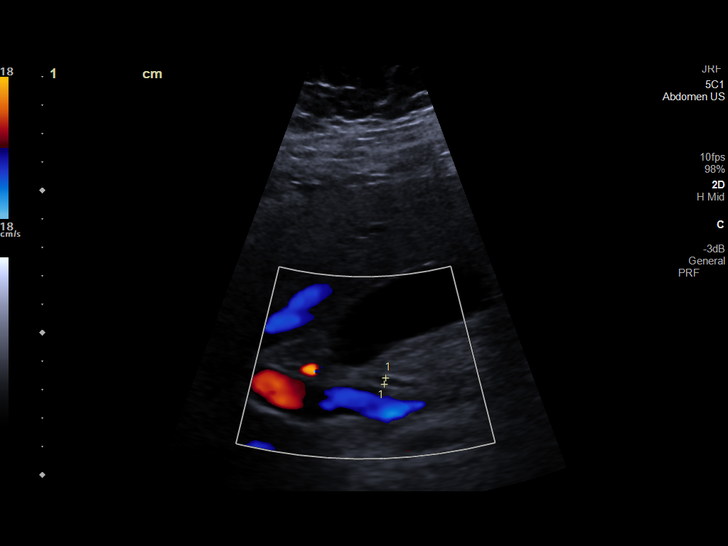
[im 18/40]
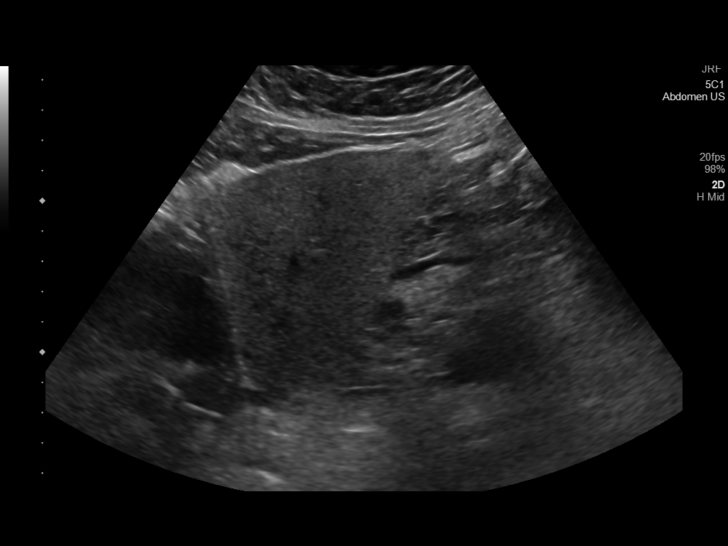
[im 22/40]
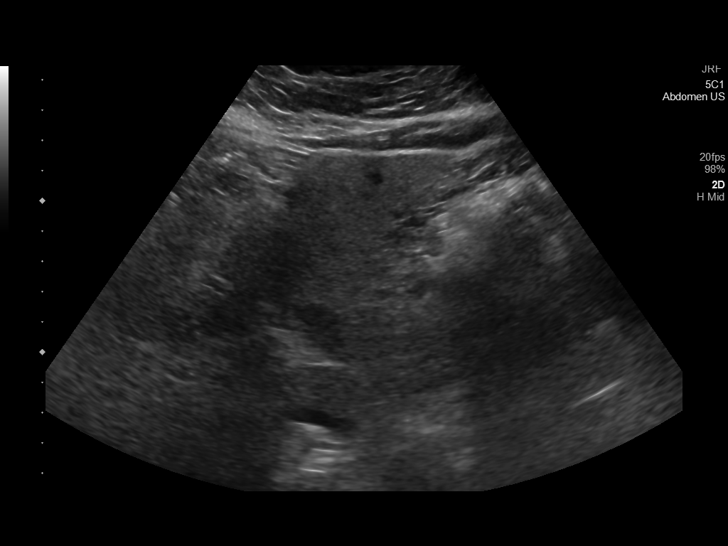
[im 25/40]
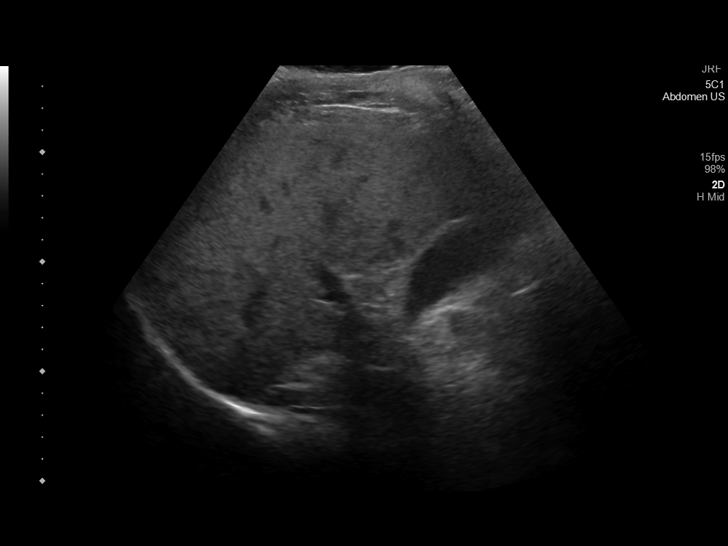
[im 27/40]
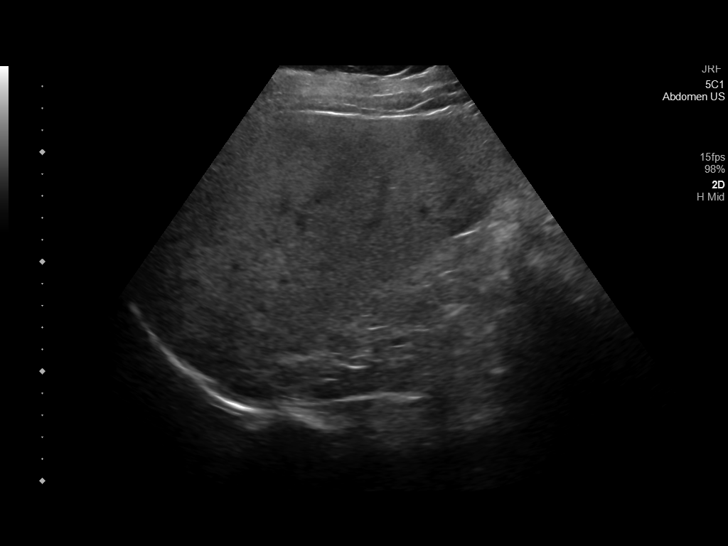
[im 30/40]
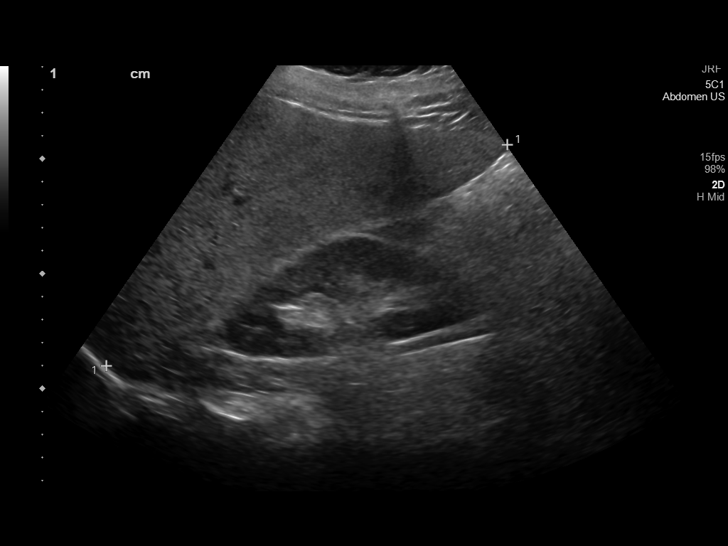
[im 33/40]
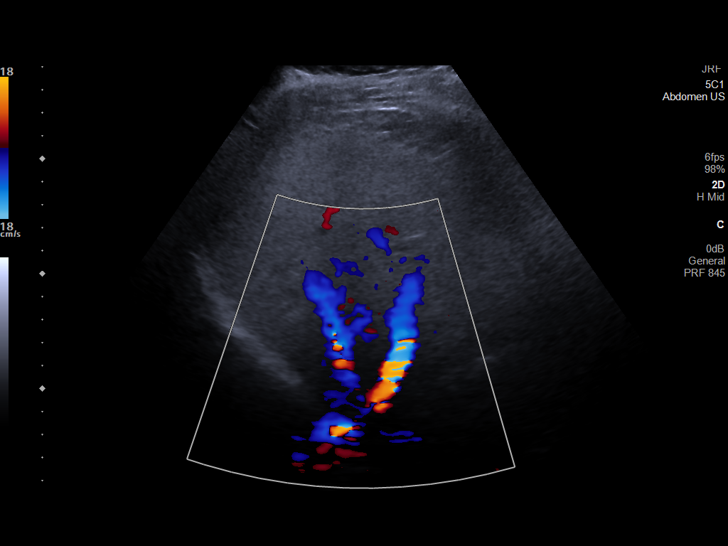
[im 36/40]
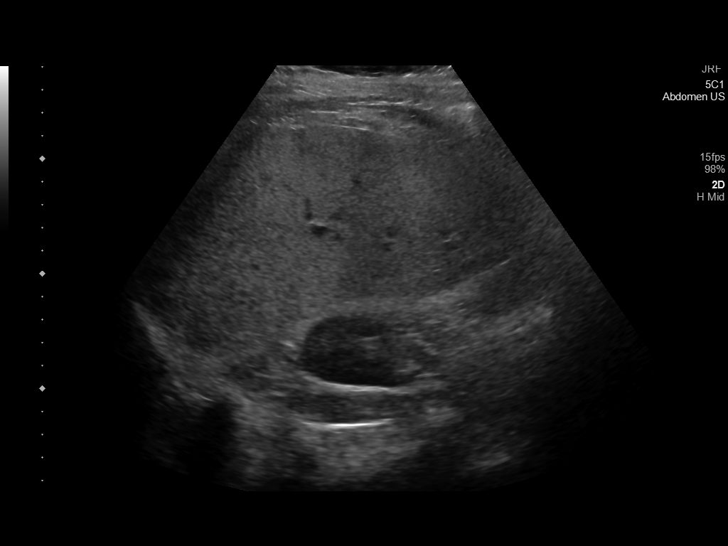
[im 40/40]
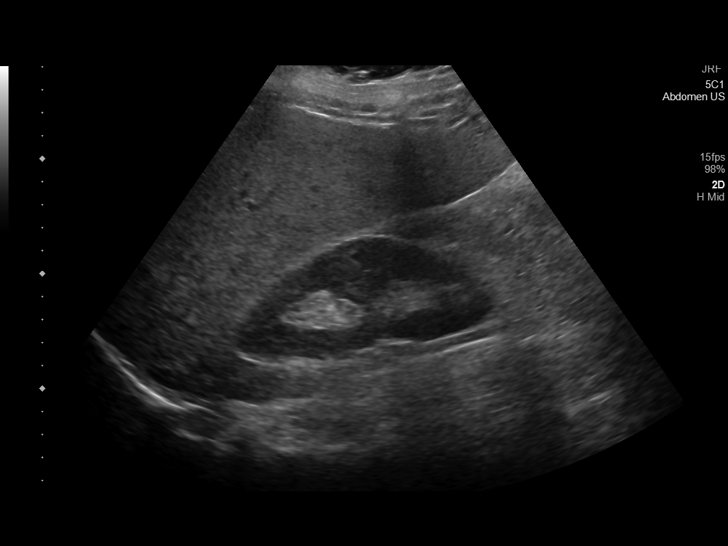

[14 of 25 positions shown; findings below may reference images not displayed]

FINDINGS: Gallbladder:

No gallstones or wall thickening visualized (1.6 mm). No sonographic
Murphy sign noted by sonographer.

Common bile duct:

Diameter: 2.3 mm

Liver:

No focal lesion identified. Diffusely increased echogenicity of the
liver parenchyma is noted. Portal vein is patent on color Doppler
imaging with normal direction of blood flow towards the liver.

Other: None.
IMPRESSION: Hepatic steatosis without focal liver lesions.

## 2023-04-03 ENCOUNTER — Ambulatory Visit: Payer: 59 | Admitting: Dermatology

## 2023-04-03 VITALS — BP 130/76 | HR 76

## 2023-04-03 DIAGNOSIS — L814 Other melanin hyperpigmentation: Secondary | ICD-10-CM | POA: Diagnosis not present

## 2023-04-03 DIAGNOSIS — L570 Actinic keratosis: Secondary | ICD-10-CM | POA: Diagnosis not present

## 2023-04-03 DIAGNOSIS — L719 Rosacea, unspecified: Secondary | ICD-10-CM

## 2023-04-03 DIAGNOSIS — L821 Other seborrheic keratosis: Secondary | ICD-10-CM

## 2023-04-03 DIAGNOSIS — W908XXA Exposure to other nonionizing radiation, initial encounter: Secondary | ICD-10-CM | POA: Diagnosis not present

## 2023-04-03 DIAGNOSIS — L820 Inflamed seborrheic keratosis: Secondary | ICD-10-CM

## 2023-04-03 DIAGNOSIS — L578 Other skin changes due to chronic exposure to nonionizing radiation: Secondary | ICD-10-CM

## 2023-04-03 DIAGNOSIS — Z7189 Other specified counseling: Secondary | ICD-10-CM

## 2023-04-03 NOTE — Progress Notes (Signed)
Follow-Up Visit   Subjective  Kevin Crawford is a 62 y.o. male who presents for the following: Spot - scalp, irregular appearing, patient concerned and would like checked today  The patient has spots, moles and lesions to be evaluated, some may be new or changing and the patient may have concern these could be cancer.  The following portions of the chart were reviewed this encounter and updated as appropriate: medications, allergies, medical history  Review of Systems:  No other skin or systemic complaints except as noted in HPI or Assessment and Plan.  Objective  Well appearing patient in no apparent distress; mood and affect are within normal limits.  A focused examination was performed of the following areas: The scalp, face, arms, hands  Relevant physical exam findings are noted in the Assessment and Plan.  Scalp x 1 Erythematous thin papules/macules with gritty scale.   R hand x 1, L hand x 1 (2) Erythematous stuck-on, waxy papule or plaque    Assessment & Plan   AK (actinic keratosis) Scalp x 1  Actinic keratoses are precancerous spots that appear secondary to cumulative UV radiation exposure/sun exposure over time. They are chronic with expected duration over 1 year. A portion of actinic keratoses will progress to squamous cell carcinoma of the skin. It is not possible to reliably predict which spots will progress to skin cancer and so treatment is recommended to prevent development of skin cancer.  Recommend daily broad spectrum sunscreen SPF 30+ to sun-exposed areas, reapply every 2 hours as needed.  Recommend staying in the shade or wearing long sleeves, sun glasses (UVA+UVB protection) and wide brim hats (4-inch brim around the entire circumference of the hat). Call for new or changing lesions.   Destruction of lesion - Scalp x 1 Complexity: simple   Destruction method: cryotherapy   Informed consent: discussed and consent obtained   Timeout:  patient  name, date of birth, surgical site, and procedure verified Lesion destroyed using liquid nitrogen: Yes   Region frozen until ice ball extended beyond lesion: Yes   Outcome: patient tolerated procedure well with no complications   Post-procedure details: wound care instructions given    Inflamed seborrheic keratosis (2) R hand x 1, L hand x 1  Symptomatic, irritating, patient would like treated.   Destruction of lesion - R hand x 1, L hand x 1 (2) Complexity: simple   Destruction method: cryotherapy   Informed consent: discussed and consent obtained   Timeout:  patient name, date of birth, surgical site, and procedure verified Lesion destroyed using liquid nitrogen: Yes   Region frozen until ice ball extended beyond lesion: Yes   Outcome: patient tolerated procedure well with no complications   Post-procedure details: wound care instructions given    ACTINIC DAMAGE - chronic, secondary to cumulative UV radiation exposure/sun exposure over time - diffuse scaly erythematous macules with underlying dyspigmentation - Recommend daily broad spectrum sunscreen SPF 30+ to sun-exposed areas, reapply every 2 hours as needed.  - Recommend staying in the shade or wearing long sleeves, sun glasses (UVA+UVB protection) and wide brim hats (4-inch brim around the entire circumference of the hat). - Call for new or changing lesions.  SEBORRHEIC KERATOSIS - Stuck-on, waxy, tan-brown papules and/or plaques  - Benign-appearing - Discussed benign etiology and prognosis. - Observe - Call for any changes  LENTIGINES Exam: scattered tan macules Due to sun exposure Treatment Plan: Benign-appearing, observe. Recommend daily broad spectrum sunscreen SPF 30+ to sun-exposed areas, reapply  every 2 hours as needed.  Call for any changes  ROSACEA Exam Mid face erythema with telangiectasias +/-   Chronic and persistent condition with duration or expected duration over one year. Condition is symptomatic/  bothersome to patient. Not currently at goal.  Rosacea is a chronic progressive skin condition usually affecting the face of adults, causing redness and/or acne bumps. It is treatable but not curable. It sometimes affects the eyes (ocular rosacea) as well. It may respond to topical and/or systemic medication and can flare with stress, sun exposure, alcohol, exercise, topical steroids (including hydrocortisone/cortisone 10) and some foods.  Daily application of broad spectrum spf 30+ sunscreen to face is recommended to reduce flares.  Treatment Plan Counseling for BBL / IPL / Laser and Coordination of Care Discussed the treatment option of Broad Band Light (BBL) /Intense Pulsed Light (IPL)/ Laser for skin discoloration, including brown spots and redness.  Typically we recommend at least 1-3 treatment sessions about 5-8 weeks apart for best results.  Cannot have tanned skin when BBL performed, and regular use of sunscreen/photoprotection is advised after the procedure to help maintain results. The patient's condition may also require "maintenance treatments" in the future.  The fee for BBL / laser treatments is $350 per treatment session for the whole face.  A fee can be quoted for other parts of the body.  Insurance typically does not pay for BBL/laser treatments and therefore the fee is an out-of-pocket cost. Recommend prophylactic valtrex treatment. Once scheduled for procedure, will send Rx in prior to patient's appointment.    Return for appointment as scheduled.  Maylene Roes, CMA, am acting as scribe for Armida Sans, MD .   Documentation: I have reviewed the above documentation for accuracy and completeness, and I agree with the above.  Armida Sans, MD

## 2023-04-03 NOTE — Patient Instructions (Signed)

## 2023-04-11 ENCOUNTER — Encounter: Payer: Self-pay | Admitting: Dermatology

## 2023-04-25 ENCOUNTER — Other Ambulatory Visit: Payer: Self-pay | Admitting: Family Medicine

## 2023-04-25 DIAGNOSIS — I10 Essential (primary) hypertension: Secondary | ICD-10-CM

## 2023-08-11 ENCOUNTER — Other Ambulatory Visit: Payer: Self-pay | Admitting: Internal Medicine

## 2023-08-11 DIAGNOSIS — R1012 Left upper quadrant pain: Secondary | ICD-10-CM

## 2023-08-15 ENCOUNTER — Other Ambulatory Visit: Payer: Self-pay | Admitting: Internal Medicine

## 2023-08-15 DIAGNOSIS — R1012 Left upper quadrant pain: Secondary | ICD-10-CM

## 2023-08-15 DIAGNOSIS — Z800 Family history of malignant neoplasm of digestive organs: Secondary | ICD-10-CM

## 2023-08-21 ENCOUNTER — Ambulatory Visit
Admission: RE | Admit: 2023-08-21 | Discharge: 2023-08-21 | Disposition: A | Discharge status: Home / Self Care | Payer: 59 | Source: Ambulatory Visit | Attending: Internal Medicine | Admitting: Internal Medicine

## 2023-08-21 DIAGNOSIS — R1012 Left upper quadrant pain: Secondary | ICD-10-CM

## 2023-08-21 DIAGNOSIS — Z800 Family history of malignant neoplasm of digestive organs: Secondary | ICD-10-CM

## 2023-08-21 MED ORDER — IOPAMIDOL (ISOVUE-300) INJECTION 61%
100.0000 mL | Freq: Once | INTRAVENOUS | Status: AC | PRN
  Administered 2023-08-21: 100 mL via INTRAVENOUS

## 2024-02-29 ENCOUNTER — Ambulatory Visit: Payer: 59 | Admitting: Dermatology

## 2024-10-22 ENCOUNTER — Ambulatory Visit: Admitting: Dermatology
